# Patient Record
Sex: Male | Born: 1950 | Race: White | Hispanic: No | Marital: Married | Smoking: Never smoker
Health system: Southern US, Community
[De-identification: ages and names within clinical notes are randomized; demographics above are authoritative.]

## PROBLEM LIST (undated history)

## (undated) DIAGNOSIS — K219 Gastro-esophageal reflux disease without esophagitis: Secondary | ICD-10-CM

## (undated) DIAGNOSIS — M545 Low back pain, unspecified: Secondary | ICD-10-CM

## (undated) DIAGNOSIS — I77819 Aortic ectasia, unspecified site: Secondary | ICD-10-CM

## (undated) DIAGNOSIS — G8929 Other chronic pain: Secondary | ICD-10-CM

## (undated) DIAGNOSIS — E785 Hyperlipidemia, unspecified: Secondary | ICD-10-CM

## (undated) DIAGNOSIS — H9190 Unspecified hearing loss, unspecified ear: Secondary | ICD-10-CM

## (undated) DIAGNOSIS — G609 Hereditary and idiopathic neuropathy, unspecified: Secondary | ICD-10-CM

## (undated) DIAGNOSIS — G629 Polyneuropathy, unspecified: Secondary | ICD-10-CM

## (undated) DIAGNOSIS — R1310 Dysphagia, unspecified: Secondary | ICD-10-CM

## (undated) DIAGNOSIS — R972 Elevated prostate specific antigen [PSA]: Secondary | ICD-10-CM

## (undated) DIAGNOSIS — N529 Male erectile dysfunction, unspecified: Secondary | ICD-10-CM

## (undated) DIAGNOSIS — K0510 Chronic gingivitis, plaque induced: Secondary | ICD-10-CM

## (undated) DIAGNOSIS — M1990 Unspecified osteoarthritis, unspecified site: Secondary | ICD-10-CM

## (undated) DIAGNOSIS — G4730 Sleep apnea, unspecified: Secondary | ICD-10-CM

## (undated) DIAGNOSIS — T4145XA Adverse effect of unspecified anesthetic, initial encounter: Secondary | ICD-10-CM

## (undated) DIAGNOSIS — R110 Nausea: Secondary | ICD-10-CM

## (undated) DIAGNOSIS — G43909 Migraine, unspecified, not intractable, without status migrainosus: Secondary | ICD-10-CM

## (undated) HISTORY — DX: Elevated prostate specific antigen (PSA): R97.20

## (undated) HISTORY — DX: Dysphagia, unspecified: R13.10

## (undated) HISTORY — DX: Aortic ectasia, unspecified site: I77.819

## (undated) HISTORY — DX: Gastro-esophageal reflux disease without esophagitis: K21.9

## (undated) HISTORY — DX: Polyneuropathy, unspecified: G62.9

## (undated) HISTORY — DX: Male erectile dysfunction, unspecified: N52.9

## (undated) HISTORY — PX: PROSTATE BIOPSY: SHX241

## (undated) HISTORY — DX: Chronic gingivitis, plaque induced: K05.10

## (undated) HISTORY — DX: Hereditary and idiopathic neuropathy, unspecified: G60.9

## (undated) HISTORY — DX: Nausea: R11.0

## (undated) HISTORY — DX: Sleep apnea, unspecified: G47.30

## (undated) HISTORY — PX: OTHER SURGICAL HISTORY: SHX169

## (undated) HISTORY — DX: Hyperlipidemia, unspecified: E78.5

## (undated) HISTORY — DX: Unspecified hearing loss, unspecified ear: H91.90

---
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
## 1968-04-03 DIAGNOSIS — T8859XA Other complications of anesthesia, initial encounter: Secondary | ICD-10-CM

## 1968-04-03 HISTORY — PX: OTHER SURGICAL HISTORY: SHX169

## 1968-04-03 HISTORY — DX: Other complications of anesthesia, initial encounter: T88.59XA

## 1984-04-03 HISTORY — PX: OTHER SURGICAL HISTORY: SHX169

## 2010-01-13 ENCOUNTER — Encounter: Admission: RE | Admit: 2010-01-13 | Discharge: 2010-01-13 | Payer: Self-pay | Admitting: Family Medicine

## 2010-01-26 ENCOUNTER — Encounter
Admission: RE | Admit: 2010-01-26 | Discharge: 2010-03-14 | Payer: Self-pay | Source: Home / Self Care | Attending: Family Medicine | Admitting: Family Medicine

## 2011-03-10 ENCOUNTER — Ambulatory Visit (HOSPITAL_COMMUNITY)
Admission: RE | Admit: 2011-03-10 | Discharge: 2011-03-10 | Disposition: A | Discharge status: Home / Self Care | Payer: BC Managed Care – PPO | Source: Ambulatory Visit | Attending: Family Medicine | Admitting: Family Medicine

## 2011-03-10 ENCOUNTER — Encounter: Payer: Self-pay | Admitting: Physical Medicine and Rehabilitation

## 2011-03-10 ENCOUNTER — Emergency Department (HOSPITAL_COMMUNITY): Payer: BC Managed Care – PPO

## 2011-03-10 ENCOUNTER — Other Ambulatory Visit (HOSPITAL_COMMUNITY): Payer: Self-pay | Admitting: Family Medicine

## 2011-03-10 ENCOUNTER — Emergency Department (HOSPITAL_COMMUNITY)
Admission: EM | Admit: 2011-03-10 | Discharge: 2011-03-10 | Disposition: A | Discharge status: Home / Self Care | Payer: BC Managed Care – PPO | Attending: Emergency Medicine | Admitting: Emergency Medicine

## 2011-03-10 DIAGNOSIS — K8050 Calculus of bile duct without cholangitis or cholecystitis without obstruction: Secondary | ICD-10-CM

## 2011-03-10 DIAGNOSIS — R109 Unspecified abdominal pain: Secondary | ICD-10-CM | POA: Insufficient documentation

## 2011-03-10 DIAGNOSIS — R1013 Epigastric pain: Secondary | ICD-10-CM | POA: Insufficient documentation

## 2011-03-10 DIAGNOSIS — R945 Abnormal results of liver function studies: Secondary | ICD-10-CM | POA: Insufficient documentation

## 2011-03-10 DIAGNOSIS — R112 Nausea with vomiting, unspecified: Secondary | ICD-10-CM | POA: Insufficient documentation

## 2011-03-10 LAB — COMPREHENSIVE METABOLIC PANEL
BUN: 17 mg/dL (ref 6–23)
CO2: 28 mEq/L (ref 19–32)
Chloride: 102 mEq/L (ref 96–112)
Creatinine, Ser: 1.13 mg/dL (ref 0.50–1.35)
GFR calc Af Amer: 79 mL/min — ABNORMAL LOW (ref >90)
GFR calc non Af Amer: 68 mL/min — ABNORMAL LOW (ref >90)
Glucose, Bld: 115 mg/dL — ABNORMAL HIGH (ref 70–99)
Total Bilirubin: 4.6 mg/dL — ABNORMAL HIGH (ref 0.3–1.2)

## 2011-03-10 LAB — URINALYSIS, ROUTINE W REFLEX MICROSCOPIC
Glucose, UA: NEGATIVE mg/dL
Hgb urine dipstick: NEGATIVE
Specific Gravity, Urine: 1.029 (ref 1.005–1.030)

## 2011-03-10 LAB — URINE MICROSCOPIC-ADD ON

## 2011-03-10 LAB — CBC
HCT: 46.7 % (ref 39.0–52.0)
MCV: 86.2 fL (ref 78.0–100.0)
RBC: 5.42 MIL/uL (ref 4.22–5.81)
WBC: 9.6 10*3/uL (ref 4.0–10.5)

## 2011-03-10 MED ORDER — ONDANSETRON HCL 4 MG/2ML IJ SOLN
4.0000 mg | Freq: Once | INTRAMUSCULAR | Status: AC
  Administered 2011-03-10: 4 mg via INTRAVENOUS
  Filled 2011-03-10: qty 2

## 2011-03-10 MED ORDER — SODIUM CHLORIDE 0.9 % IV BOLUS (SEPSIS)
500.0000 mL | Freq: Once | INTRAVENOUS | Status: AC
  Administered 2011-03-10: 1000 mL via INTRAVENOUS

## 2011-03-10 MED ORDER — HYDROCODONE-ACETAMINOPHEN 10-500 MG PO TABS: 1.0000 | ORAL_TABLET | Freq: Four times a day (QID) | ORAL | Status: AC | PRN

## 2011-03-10 MED ORDER — IOHEXOL 300 MG/ML  SOLN
100.0000 mL | Freq: Once | INTRAMUSCULAR | Status: AC | PRN
  Administered 2011-03-10: 100 mL via INTRAVENOUS

## 2011-03-10 MED ORDER — MORPHINE SULFATE 4 MG/ML IJ SOLN
4.0000 mg | Freq: Once | INTRAMUSCULAR | Status: AC
  Administered 2011-03-10: 4 mg via INTRAVENOUS
  Filled 2011-03-10: qty 1

## 2011-03-10 MED ORDER — OXYCODONE-ACETAMINOPHEN 5-325 MG PO TABS: 2.0000 | ORAL_TABLET | ORAL | Status: DC | PRN

## 2011-03-10 MED ORDER — SODIUM CHLORIDE 0.9 % IV SOLN: INTRAVENOUS | Status: DC

## 2011-03-10 NOTE — ED Provider Notes (Signed)
  Physical Exam  BP 123/89  Pulse 75  Temp(Src) 97.9 F (36.6 C) (Oral)  Resp 18  SpO2 96%  Physical Exam  Abdominal pain for the last several days. Abnormal LFTs. Nausea vomiting. Negative ultrasound. Mild tenderness. Will get CT and had a lipase.  ED Course  Procedures      Juliet Rude. Rubin Payor, MD 03/10/11 1818

## 2011-03-10 NOTE — ED Notes (Signed)
Transported to CT now

## 2011-03-10 NOTE — ED Notes (Signed)
Pt presents to department from PCP office for evaluation of abdominal pain. Ongoing for several days. Had ultrasound this morning, was negative. Pt states diffuse abdominal pain radiating to back. Also states nausea/vomiting. Pt states mild fever on Wednesday. He is alert and oriented x4. 8/10 pain upon arrival to ED.

## 2011-03-10 NOTE — ED Notes (Signed)
[facility] paged to [phone]

## 2011-03-13 ENCOUNTER — Encounter (HOSPITAL_COMMUNITY): Payer: Self-pay | Admitting: Gastroenterology

## 2011-03-14 ENCOUNTER — Encounter (HOSPITAL_COMMUNITY)
Admission: RE | Disposition: A | Discharge status: Home / Self Care | Payer: Self-pay | Source: Ambulatory Visit | Attending: Gastroenterology

## 2011-03-14 ENCOUNTER — Encounter (HOSPITAL_COMMUNITY): Payer: Self-pay | Admitting: Anesthesiology

## 2011-03-14 ENCOUNTER — Ambulatory Visit (HOSPITAL_COMMUNITY)
Admission: RE | Admit: 2011-03-14 | Discharge: 2011-03-14 | Disposition: A | Discharge status: Home / Self Care | Payer: BC Managed Care – PPO | Source: Ambulatory Visit | Attending: Gastroenterology | Admitting: Gastroenterology

## 2011-03-14 ENCOUNTER — Ambulatory Visit (HOSPITAL_COMMUNITY): Payer: BC Managed Care – PPO | Admitting: Anesthesiology

## 2011-03-14 ENCOUNTER — Encounter (HOSPITAL_COMMUNITY): Payer: Self-pay | Admitting: *Deleted

## 2011-03-14 ENCOUNTER — Ambulatory Visit (HOSPITAL_COMMUNITY): Payer: BC Managed Care – PPO

## 2011-03-14 DIAGNOSIS — K8070 Calculus of gallbladder and bile duct without cholecystitis without obstruction: Secondary | ICD-10-CM | POA: Insufficient documentation

## 2011-03-14 DIAGNOSIS — R109 Unspecified abdominal pain: Secondary | ICD-10-CM | POA: Insufficient documentation

## 2011-03-14 DIAGNOSIS — R7989 Other specified abnormal findings of blood chemistry: Secondary | ICD-10-CM | POA: Insufficient documentation

## 2011-03-14 HISTORY — DX: Migraine, unspecified, not intractable, without status migrainosus: G43.909

## 2011-03-14 HISTORY — PX: ERCP: SHX5425

## 2011-03-14 HISTORY — PX: SPHINCTEROTOMY: SHX5544

## 2011-03-14 HISTORY — DX: Gastro-esophageal reflux disease without esophagitis: K21.9

## 2011-03-14 SURGERY — ERCP, WITH INTERVENTION IF INDICATED
Anesthesia: Monitor Anesthesia Care

## 2011-03-14 MED ORDER — PROMETHAZINE HCL 25 MG/ML IJ SOLN: 6.2500 mg | INTRAMUSCULAR | Status: DC | PRN

## 2011-03-14 MED ORDER — FENTANYL CITRATE 0.05 MG/ML IJ SOLN
INTRAMUSCULAR | Status: DC | PRN
  Administered 2011-03-14 (×2): 50 ug via INTRAVENOUS

## 2011-03-14 MED ORDER — FENTANYL CITRATE 0.05 MG/ML IJ SOLN: 25.0000 ug | INTRAMUSCULAR | Status: DC | PRN

## 2011-03-14 MED ORDER — PROPOFOL 10 MG/ML IV EMUL
INTRAVENOUS | Status: DC | PRN
  Administered 2011-03-14: 75 ug/kg/min via INTRAVENOUS

## 2011-03-14 MED ORDER — SODIUM CHLORIDE 0.9 % IV SOLN
INTRAVENOUS | Status: DC | PRN
  Administered 2011-03-14: 12:00:00 via INTRAVENOUS

## 2011-03-14 MED ORDER — MIDAZOLAM HCL 5 MG/5ML IJ SOLN
INTRAMUSCULAR | Status: DC | PRN
  Administered 2011-03-14: 2 mg via INTRAVENOUS

## 2011-03-14 MED ORDER — LACTATED RINGERS IV SOLN
INTRAVENOUS | Status: DC
  Administered 2011-03-14: 1000 mL via INTRAVENOUS

## 2011-03-14 MED ORDER — SODIUM CHLORIDE 0.9 % IV SOLN
INTRAVENOUS | Status: DC | PRN
  Administered 2011-03-14: 13:00:00

## 2011-03-14 MED ORDER — BUTAMBEN-TETRACAINE-BENZOCAINE 2-2-14 % EX AERO
INHALATION_SPRAY | CUTANEOUS | Status: DC | PRN
  Administered 2011-03-14: 2 via TOPICAL

## 2011-03-14 MED ORDER — GLUCAGON HCL (RDNA) 1 MG IJ SOLR
INTRAMUSCULAR | Status: DC | PRN
  Administered 2011-03-14: 1 mg via INTRAVENOUS

## 2011-03-14 MED ORDER — CIPROFLOXACIN IN D5W 400 MG/200ML IV SOLN
INTRAVENOUS | Status: AC
  Filled 2011-03-14: qty 200

## 2011-03-14 MED ORDER — CIPROFLOXACIN IN D5W 400 MG/200ML IV SOLN
INTRAVENOUS | Status: DC | PRN
  Administered 2011-03-14: 400 mg via INTRAVENOUS

## 2011-03-14 NOTE — Anesthesia Postprocedure Evaluation (Signed)
  Anesthesia Post-op Note  Patient: Jeffrey Li  Procedure(s) Performed:  ENDOSCOPIC RETROGRADE CHOLANGIOPANCREATOGRAPHY (ERCP); SPHINCTEROTOMY  Patient Location: PACU  Anesthesia Type: MAC  Level of Consciousness: oriented and sedated  Airway and Oxygen Therapy: Patient Spontanous Breathing  Post-op Pain: mild  Post-op Assessment: Post-op Vital signs reviewed, Patient's Cardiovascular Status Stable, Respiratory Function Stable and Patent Airway  Post-op Vital Signs: stable  Complications: No apparent anesthesia complications

## 2011-03-14 NOTE — Transfer of Care (Signed)
Immediate Anesthesia Transfer of Care Note  Patient: Jeffrey Li  Procedure(s) Performed:  ENDOSCOPIC RETROGRADE CHOLANGIOPANCREATOGRAPHY (ERCP); SPHINCTEROTOMY  Patient Location: PACU  Anesthesia Type: MAC  Level of Consciousness: awake and sedated  Airway & Oxygen Therapy: Patient Spontanous Breathing and Patient connected to nasal cannula oxygen  Post-op Assessment: Report given to PACU RN and Post -op Vital signs reviewed and stable  Post vital signs: Reviewed and stable  Complications: No apparent anesthesia complications

## 2011-03-14 NOTE — Progress Notes (Signed)
Patient positioned in prone, safety belt applied,chest rolls applied,both lower extremity supported with a roll.

## 2011-03-14 NOTE — H&P (Signed)
Patient interval history reviewed.  Patient examined again.  There has been no change from documented H/P dated 03/13/2011 (scanned into chart from our office) except as documented above.

## 2011-03-14 NOTE — Op Note (Signed)
[facility] [address] [place], [place]  [address]  ERCP PROCEDURE REPORT  PATIENT:  Jeffrey Li, Jeffrey Li  MR#:  [id number] BIRTHDATE:  [birth year]  GENDER:  male  ENDOSCOPIST:  Willis Modena, MD REFRRING:           Darrow Bussing, MD  PROCEDURE DATE:  03/14/2011 PROCEDURE:  ERCP with sphincterotomy ASA CLASS:  Class II  INDICATIONS:  abdominal pain, elevated LFTs, dilated bile duct, CBD stone (seen on CT scan)  MEDICATIONS:   MAC sedation, administered by CRNA, cipro 400 mg IV, glucagon 1 mg iv, Cetacaine spray x 2  DESCRIPTION OF PROCEDURE:   After the risks benefits and alternatives of the procedure were thoroughly explained, informed consent was obtained.  The  endoscope was introduced through the mouth and advanced to the second portion of the duodenum.  The examination was normal with no endoscopic findings.  The scope was then completely withdrawn from the patient and the procedure terminated.  <<PROCEDUREIMAGES>>  FINDINGS:  Patulous but otherwise normal-appearing ampulla; good bile flow pre- and post-procedure.  Deep biliary access obtained. CBD about 8mm in diameter.  There was a small   5mm defect at the junction of the CBD/CHD.  Limited cystic duct and gallbladder filling normal.  Moderate biliary  sphincterotomy was performed with blended current without complication.  Variety of basket and balloon catheters were used to trawl the duct.  While I did not witness removal of a stone, all subsequent cholangiograms showed no residual filling defects.  There were no immediate complications.  Pancreatogram was not obtained, intentionally.  ENDOSCOPIC IMPRESSION:    1.  Cholecholithiasis, s/p ERCP with biliary sphincterotomy. 2.  No residual defects on post occlusion cholangiogram.  RECOMMENDATIONS:      1.  Watch for potential complications of procedure. 2.  No ASA/NSAIDs for 5 days post-sphincterotomy. 3.  Consider surgical evaluation for consideration  of cholecystectomy.  ______________________________ Willis Modena  CC:  n. eSIGNEDWillis Modena at 03/14/2011 01:01 PM  Leanor Kail, [id number]

## 2011-03-14 NOTE — Anesthesia Preprocedure Evaluation (Signed)
Anesthesia Evaluation  Patient identified by MRN, date of birth, ID band Patient awake    Reviewed: Allergy & Precautions, H&P , NPO status , Patient's Chart, lab work & pertinent test results, reviewed documented beta blocker date and time   Airway Mallampati: I TM Distance: >3 FB Neck ROM: Full    Dental  (+) Teeth Intact   Pulmonary neg pulmonary ROS,  clear to auscultation        Cardiovascular neg cardio ROS Regular Normal Denies cardiac symptoms   Neuro/Psych Negative Neurological ROS  Negative Psych ROS   GI/Hepatic Neg liver ROS, Bile duct stone   Endo/Other  Negative Endocrine ROS  Renal/GU negative Renal ROS  Genitourinary negative   Musculoskeletal negative musculoskeletal ROS (+)   Abdominal   Peds negative pediatric ROS (+)  Hematology negative hematology ROS (+)   Anesthesia Other Findings   Reproductive/Obstetrics negative OB ROS                           Anesthesia Physical Anesthesia Plan  ASA: II  Anesthesia Plan: General and MAC   Post-op Pain Management:    Induction:   Airway Management Planned: Mask  Additional Equipment:   Intra-op Plan:   Post-operative Plan:   Informed Consent: I have reviewed the patients History and Physical, chart, labs and discussed the procedure including the risks, benefits and alternatives for the proposed anesthesia with the patient or authorized representative who has indicated his/her understanding and acceptance.     Plan Discussed with: CRNA and Surgeon  Anesthesia Plan Comments:         Anesthesia Quick Evaluation

## 2011-03-15 ENCOUNTER — Encounter (HOSPITAL_COMMUNITY): Payer: Self-pay | Admitting: Gastroenterology

## 2011-11-02 ENCOUNTER — Other Ambulatory Visit: Payer: Self-pay | Admitting: Family Medicine

## 2011-11-02 DIAGNOSIS — M545 Low back pain, unspecified: Secondary | ICD-10-CM

## 2011-11-16 ENCOUNTER — Ambulatory Visit
Admission: RE | Admit: 2011-11-16 | Discharge: 2011-11-16 | Disposition: A | Discharge status: Home / Self Care | Payer: BC Managed Care – PPO | Source: Ambulatory Visit | Attending: Family Medicine | Admitting: Family Medicine

## 2011-11-16 DIAGNOSIS — M545 Low back pain, unspecified: Secondary | ICD-10-CM

## 2014-04-03 HISTORY — PX: RETINAL TEAR REPAIR CRYOTHERAPY: SHX5304

## 2016-04-05 ENCOUNTER — Other Ambulatory Visit: Payer: Self-pay | Admitting: Family Medicine

## 2016-04-05 ENCOUNTER — Ambulatory Visit
Admission: RE | Admit: 2016-04-05 | Discharge: 2016-04-05 | Disposition: A | Discharge status: Home / Self Care | Payer: BC Managed Care – PPO | Source: Ambulatory Visit | Attending: Family Medicine | Admitting: Family Medicine

## 2016-04-05 DIAGNOSIS — M25551 Pain in right hip: Secondary | ICD-10-CM

## 2017-04-19 ENCOUNTER — Encounter: Payer: Self-pay | Admitting: Neurology

## 2017-06-07 ENCOUNTER — Ambulatory Visit: Payer: BC Managed Care – PPO | Admitting: Neurology

## 2017-06-07 ENCOUNTER — Other Ambulatory Visit: Payer: Self-pay

## 2017-06-07 ENCOUNTER — Encounter: Payer: Self-pay | Admitting: Neurology

## 2017-06-07 VITALS — BP 127/74 | HR 78 | Ht 73.0 in | Wt 215.5 lb

## 2017-06-07 DIAGNOSIS — R202 Paresthesia of skin: Secondary | ICD-10-CM

## 2017-06-07 NOTE — Progress Notes (Signed)
Reason for visit: Peripheral neuropathy  Referring physician: Dr. Adele Dan is a 67 y.o. male  History of present illness:  Jeffrey Li is a 67 year old left-handed white male with a history of numbness in the feet that dates back about 3 or 4 years.  The patient has noted slow gradual progression of his symptoms over time, the symptoms initially were in the toes only, now the entire foot is involved with numbness.  The patient has cold sensations and at times, particularly at night he may have burning sensations in the feet.  He denies that this keeps him awake at night.  He denies any balance issues or weakness in the legs.  He does have chronic low back pain without radiation down the legs on either side.  He last had MRI evaluation of the lumbar spine in 2013 that did not show evidence of spinal stenosis.  The patient reports no difficulty controlling the bowels or the bladder.  He does not have sensory alterations in the hands.  He does have some occasional neck pain.  The patient has had some blood work that included a vitamin B12 level that was unremarkable.  ANA was negative, sed rate was 1.  The patient comes to this office for an evaluation.  Past Medical History:  Diagnosis Date  . Erectile dysfunction   . GERD (gastroesophageal reflux disease)   . Gout   . Migraine headache   . Sleep apnea     Past Surgical History:  Procedure Laterality Date  . ERCP  03/14/2011   Procedure: ENDOSCOPIC RETROGRADE CHOLANGIOPANCREATOGRAPHY (ERCP);  Surgeon: Landry Dyke, MD;  Location: [facility];  Service: Endoscopy;  Laterality: N/A;  . left knee  1986  . RETINAL TEAR REPAIR CRYOTHERAPY Right 2016  . right shoulder  1970   Repair of torn AC joint right shoulder  . SPHINCTEROTOMY  03/14/2011   Procedure: SPHINCTEROTOMY;  Surgeon: Landry Dyke, MD;  Location: [facility];  Service: Endoscopy;  Laterality: N/A;    Family History  Problem Relation Age of Onset  .  Stroke Mother   . Prostate cancer Father   . Glaucoma Father   . Kidney failure Father   . Thyroid disease Sister   . Diverticulitis Sister   . Thyroid disease Sister   . Thyroid disease Sister   . Migraines Brother   . Colon cancer Neg Hx     Social history:  reports that  has never smoked. he has never used smokeless tobacco. He reports that he drinks alcohol. He reports that he uses drugs. Drug: Hydrocodone.  Medications:  Prior to Admission medications   Medication Sig Start Date End Date Taking? Authorizing Provider  allopurinol (ZYLOPRIM) 300 MG tablet Take 150 mg by mouth daily.   Yes [provider]  Alpha-Lipoic Acid 300 MG CAPS Take 2 capsules by mouth daily.   Yes [provider]  B COMPLEX-BIOTIN-FA PO Take 2 capsules by mouth daily.   Yes [provider]  Coenzyme Q10 (CO Q 10 PO) Take 400 mg by mouth daily.    Yes [provider]  esomeprazole (NEXIUM) 40 MG capsule Take 40 mg by mouth daily at 12 noon.   Yes [provider]  fluocinonide ointment (LIDEX) 7.20 % Apply 1 application topically daily as needed.   Yes [provider]  ibuprofen (ADVIL,MOTRIN) 200 MG tablet Take 200 mg by mouth 2 (two) times daily.    Yes [provider]  loratadine (CLARITIN) 10 MG tablet Take 10 mg by mouth daily.   Yes [provider]  MAGNESIUM-CALCIUM-FOLIC ACID PO Take 893 mg by mouth daily.    Yes [provider]  Multiple Vitamins-Minerals (MULTIVITAMINS THER. W/MINERALS) TABS Take 1 tablet by mouth daily.     Yes [provider]  Omega-3 Fatty Acids (OMEGA-3 FISH OIL PO) Take 720 mg by mouth daily.    Yes [provider]  sildenafil (REVATIO) 20 MG tablet Take 20 mg by mouth as needed. Take 2-5 tablets when needed   Yes [provider]  VITAMIN E PO Take 2 capsules by mouth daily. 450mg - 2 capsules daily   Yes [provider]      Allergies  Allergen Reactions  . No  Known Drug Allergy   . Shellfish Allergy Nausea And Vomiting    mollusk    ROS:  Out of a complete 14 system review of symptoms, the patient complains only of the following symptoms, and all other reviewed systems are negative.  Numbness Snoring  Blood pressure 127/74, pulse 78, height 6\' 1"  (1.854 m), weight 215 lb 8 oz (97.8 kg).  Physical Exam  General: The patient is alert and cooperative at the time of the examination.  Eyes: Pupils are equal, round, and reactive to light. Discs are flat bilaterally.  Neck: The neck is supple, no carotid bruits are noted.  Respiratory: The respiratory examination is clear.  Cardiovascular: The cardiovascular examination reveals a regular rate and rhythm, no obvious murmurs or rubs are noted.  Neuromuscular: Range of movement of the low back is full.  Skin: Extremities are without significant edema.  Neurologic Exam  Mental status: The patient is alert and oriented x 3 at the time of the examination. The patient has apparent normal recent and remote memory, with an apparently normal attention span and concentration ability.  Cranial nerves: Facial symmetry is present. There is good sensation of the face to pinprick and soft touch bilaterally. The strength of the facial muscles and the muscles to head turning and shoulder shrug are normal bilaterally. Speech is well enunciated, no aphasia or dysarthria is noted. Extraocular movements are full. Visual fields are full. The tongue is midline, and the patient has symmetric elevation of the soft palate. No obvious hearing deficits are noted.  Motor: The motor testing reveals 5 over 5 strength of all 4 extremities. Good symmetric motor tone is noted throughout.  Sensory: Sensory testing is intact to pinprick, soft touch, vibration sensation, and position sense on all 4 extremities, with exception of a stocking pattern pinprick sensory deficit up to the distal third of the legs bilaterally. No  evidence of extinction is noted.  Coordination: Cerebellar testing reveals good finger-nose-finger and heel-to-shin bilaterally.  Gait and station: Gait is normal. Tandem gait is normal. Romberg is negative. No drift is seen.  The patient is able to walk on heels and the toes bilaterally.  Reflexes: Deep tendon reflexes are symmetric and normal bilaterally. Toes are downgoing bilaterally.   Assessment/Plan:  1.  Numbness in the feet, probable peripheral neuropathy  The patient likely has a mild peripheral neuropathy.  The patient will be sent for further blood work today, nerve conduction studies will be done on both legs and one arm, EMG will be done on one leg.  He indicates that the discomfort is not severe currently, we will not start any medications for symptomatic benefit at this time.  Jill Alexanders MD 06/07/2017 8:59 AM  [facility] [address] [place], [place] [address]  Phone [phone] Fax [phone]

## 2017-06-07 NOTE — Patient Instructions (Signed)
   We will get EMG and NCV study to look at the nerve function of the legs.

## 2017-06-12 ENCOUNTER — Telehealth: Payer: Self-pay | Admitting: *Deleted

## 2017-06-12 LAB — MULTIPLE MYELOMA PANEL, SERUM
ALPHA2 GLOB SERPL ELPH-MCNC: 0.6 g/dL (ref 0.4–1.0)
Albumin SerPl Elph-Mcnc: 3.6 g/dL (ref 2.9–4.4)
Albumin/Glob SerPl: 1.4 (ref 0.7–1.7)
Alpha 1: 0.2 g/dL (ref 0.0–0.4)
B-GLOBULIN SERPL ELPH-MCNC: 1.1 g/dL (ref 0.7–1.3)
GLOBULIN, TOTAL: 2.7 g/dL (ref 2.2–3.9)
Gamma Glob SerPl Elph-Mcnc: 0.8 g/dL (ref 0.4–1.8)
IGG (IMMUNOGLOBIN G), SERUM: 714 mg/dL (ref 700–1600)
IGM (IMMUNOGLOBULIN M), SRM: 80 mg/dL (ref 20–172)
IgA/Immunoglobulin A, Serum: 163 mg/dL (ref 61–437)
Total Protein: 6.3 g/dL (ref 6.0–8.5)

## 2017-06-12 LAB — ANGIOTENSIN CONVERTING ENZYME: Angio Convert Enzyme: 58 U/L (ref 14–82)

## 2017-06-12 LAB — B. BURGDORFI ANTIBODIES

## 2017-06-12 LAB — RHEUMATOID FACTOR: Rhuematoid fact SerPl-aCnc: <10 IU/mL (ref 0.0–13.9)

## 2017-06-12 NOTE — Telephone Encounter (Signed)
Called, LVM for pt about unremarkable labs per CW,MD note. Gave [facility] phone number if he has further questions/concerns.

## 2017-06-12 NOTE — Telephone Encounter (Signed)
-----   Message from Kathrynn Ducking, MD sent at 06/12/2017  7:27 AM EDT -----   The blood work results are unremarkable. Please call the patient.  ----- Message ----- From: Lavone Neri Lab Results In Sent: 06/08/2017   7:43 AM To: Kathrynn Ducking, MD

## 2017-07-03 ENCOUNTER — Telehealth: Payer: Self-pay | Admitting: Neurology

## 2017-07-03 ENCOUNTER — Ambulatory Visit (INDEPENDENT_AMBULATORY_CARE_PROVIDER_SITE_OTHER): Payer: BC Managed Care – PPO | Admitting: Neurology

## 2017-07-03 ENCOUNTER — Encounter: Payer: Self-pay | Admitting: Neurology

## 2017-07-03 ENCOUNTER — Ambulatory Visit: Payer: BC Managed Care – PPO | Admitting: Neurology

## 2017-07-03 DIAGNOSIS — G603 Idiopathic progressive neuropathy: Secondary | ICD-10-CM

## 2017-07-03 DIAGNOSIS — R202 Paresthesia of skin: Secondary | ICD-10-CM | POA: Diagnosis not present

## 2017-07-03 DIAGNOSIS — M5416 Radiculopathy, lumbar region: Secondary | ICD-10-CM | POA: Diagnosis not present

## 2017-07-03 DIAGNOSIS — G629 Polyneuropathy, unspecified: Secondary | ICD-10-CM

## 2017-07-03 HISTORY — DX: Polyneuropathy, unspecified: G62.9

## 2017-07-03 NOTE — Telephone Encounter (Signed)
[employer] Auth: [id number] (exp. 07/03/17 to 08/01/17) lvm for pt to call back about scheduling mri

## 2017-07-03 NOTE — Progress Notes (Addendum)
Patient comes in for EMG and nerve conduction study today.  Nerve conduction studies show what appears to be a mild to moderate peripheral neuropathy, EMG of the right leg however shows changes that appeared to suggest an acute mild S1 radiculopathy, possibly some chronic changes at the L5 level as well.  Prior lumbosacral spine MRI done in 2013 did not show any spinal stenosis or nerve root impingement.  We will repeat the study.    [place]    Nerve / Sites Muscle Latency Ref. Amplitude Ref. Rel Amp Segments Distance Velocity Ref. Area    ms ms mV mV %  cm m/s m/s mVms  R Median - APB     Wrist APB 4.4 ?4.4 7.0 ?4.0 100 Wrist - APB 7   26.6     Upper arm APB 9.3  6.8  97.3 Upper arm - Wrist 24 49 ?49 25.9  R Ulnar - ADM     Wrist ADM 2.8 ?3.3 6.1 ?6.0 100 Wrist - ADM 7   21.6     B.Elbow ADM 7.4  5.6  91.1 B.Elbow - Wrist 24 52 ?49 20.9     A.Elbow ADM 9.4  5.5  98 A.Elbow - B.Elbow 10 49 ?49 20.5         A.Elbow - Wrist      R Peroneal - EDB     Ankle EDB 4.2 ?6.5 3.2 ?2.0 100 Ankle - EDB 9   11.4     Fib head EDB 13.7  2.3  72.1 Fib head - Ankle 33 35 ?44 8.7     Pop fossa EDB 16.5  2.6  112 Pop fossa - Fib head 10 36 ?44 8.8         Pop fossa - Ankle      L Peroneal - EDB     Ankle EDB 5.3 ?6.5 4.9 ?2.0 100 Ankle - EDB 9   18.6     Fib head EDB 14.8  4.6  94.1 Fib head - Ankle 33 35 ?44 17.4     Pop fossa EDB 17.7  4.8  103 Pop fossa - Fib head 10 36 ?44 18.3         Pop fossa - Ankle      L Tibial - AH     Ankle AH 6.4 ?5.8 0.7 ?4.0 100 Ankle - AH 9   1.4     Pop fossa AH 18.9  0.6  93.3 Pop fossa - Ankle 42 34 ?41 2.3  R Tibial - AH     Ankle AH 5.2 ?5.8 1.0 ?4.0 100 Ankle - AH 9   1.9     Pop fossa AH 18.2  0.6  55.1 Pop fossa - Ankle 42 32 ?41 1.4                 SNC    Nerve / Sites Rec. Site Peak Lat Ref.  Amp Ref. Segments Distance    ms ms V V  cm  L Sural - Ankle (Calf)     Calf Ankle 4.6 ?4.4 4 ?6 Calf - Ankle 14  R Sural - Ankle (Calf)     Calf Ankle 4.3 ?4.4 2  ?6 Calf - Ankle 14  L Superficial peroneal - Ankle     Lat leg Ankle 4.2 ?4.4 1 ?6 Lat leg - Ankle 14  R Superficial peroneal - Ankle     Lat leg Ankle 4.4 ?4.4 1 ?6 Lat leg - Ankle  14  R Median - Orthodromic (Dig II, Mid palm)     Dig II Wrist 4.0 ?3.4 9 ?10 Dig II - Wrist 13  R Ulnar - Orthodromic, (Dig V, Mid palm)     Dig V Wrist 2.8 ?3.1 7 ?5 Dig V - Wrist 73                 F  Wave    Nerve F Lat Ref.   ms ms  R Ulnar - ADM 33.4 ?32.0  L Tibial - AH 66.7 ?56.0         EMG full

## 2017-07-03 NOTE — Progress Notes (Signed)
Please refer to EMG and nerve conduction study procedure note. 

## 2017-07-03 NOTE — Procedures (Signed)
     HISTORY:  Jeffrey Li is a 67 year old gentleman with a history of numbness in the feet for about 3 or 4 years.  He does report chronic low back pain without radiation down the legs on either side.  The patient is being evaluated for a possible neuropathy or a lumbosacral radiculopathy.  NERVE CONDUCTION STUDIES:  Nerve conduction studies were performed on the right upper extremity.  The distal motor latency for the right median nerve was prolonged with a normal motor amplitude.  The distal motor latency motor amplitude for the right ulnar nerve was normal.  The nerve conduction velocities for the right median and ulnar nerves were normal.  The sensory latency for the right ulnar nerve was normal, but was prolonged for the right median nerve.  The F-wave latency for the right ulnar nerve was prolonged.  Nerve conduction studies were performed on both lower extremities.  The distal motor latencies for the peroneal nerves were normal bilaterally with normal motor amplitudes for these nerves.  Slowing was seen for the peroneal nerves bilaterally.  The distal motor latency for the left posterior tibial nerve was prolonged, normal on the right.  The motor amplitudes for these nerves were low bilaterally.  There was slowing seen for the posterior tibial nerves bilaterally.  The sural sensory latencies were prolonged on the left and normal on the right with a normal sensory latency for the peroneal nerve on the left, borderline normal on the right.  The left posterior tibial nerve F-wave latency was prolonged.  EMG STUDIES:  EMG study was performed on the right lower extremity:  The tibialis anterior muscle reveals 2 to 4K motor units with full recruitment. No fibrillations or positive waves were seen. The peroneus tertius muscle reveals 2 to 4K motor units with full recruitment. No fibrillations or positive waves were seen. The medial gastrocnemius muscle reveals 1 to 3K motor units with slight  reduction of recruitment.  1+ positive waves were seen. The vastus lateralis muscle reveals 2 to 4K motor units with full recruitment. No fibrillations or positive waves were seen. The iliopsoas muscle reveals 2 to 5K motor units with slightly decreased recruitment. No fibrillations or positive waves were seen. The biceps femoris muscle (long head) reveals 2 to 4K motor units with full recruitment. No fibrillations or positive waves were seen. The lumbosacral paraspinal muscles were tested at 3 levels, and revealed 1+ positive waves at the upper and middle levels, 2+ positive waves at the lower level. There was good relaxation.   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  There is evidence of an overlying mild right carpal tunnel syndrome.  EMG evaluation of the right lower extremity shows findings that are consistent with an acute mild S1 radiculopathy with some chronic changes at the L5 level as well.  Jill Alexanders MD 07/03/2017 11:17 AM  [facility] [address] [place], [place] [address]  Phone [phone] Fax [phone]

## 2017-07-03 NOTE — Telephone Encounter (Signed)
Patient returned my call he is scheduled for Wed. 07/11/17 on the [facility] mobile unit.

## 2017-07-11 ENCOUNTER — Ambulatory Visit: Payer: BC Managed Care – PPO

## 2017-07-11 DIAGNOSIS — R202 Paresthesia of skin: Secondary | ICD-10-CM

## 2017-07-11 DIAGNOSIS — M5416 Radiculopathy, lumbar region: Secondary | ICD-10-CM

## 2017-07-11 MED ORDER — GADOPENTETATE DIMEGLUMINE 469.01 MG/ML IV SOLN: 20.0000 mL | Freq: Once | INTRAVENOUS | Status: AC | PRN

## 2017-07-13 ENCOUNTER — Encounter: Payer: Self-pay | Admitting: Neurology

## 2017-07-13 ENCOUNTER — Telehealth: Payer: Self-pay | Admitting: Neurology

## 2017-07-13 NOTE — Telephone Encounter (Signed)
I called again, I am unable to reach the patient.  I will send him a letter regarding the results of the MRI.

## 2017-07-13 NOTE — Telephone Encounter (Signed)
I tried to call the patient again, no answer.  I will try again later.

## 2017-07-13 NOTE — Telephone Encounter (Signed)
  I tried to call patient, no answer.  The MRI of the low back does not show much change from 2013, no evidence of definite nerve root impingement.  Changes are noted on EMG suggesting some acute denervation and S1 nerve roots and possible overlying L5 chronic changes.  Blood work does not show an etiology for these changes.  The patient does have some low back pain, could possibly consider epidural steroid injection, initiate medication for discomfort.  No surgically amenable problems.  I will try to call back later.   MRI lumbar 07/12/17:  IMPRESSION:   MRI lumbar spine (without) demonstrating: - Minimal disc bulging at T12-L1, L1-2, L4-5. no spinal stenosis or foraminal narrowing  - No significant change from MRI on 11/16/11.

## 2018-05-13 ENCOUNTER — Other Ambulatory Visit: Payer: Self-pay | Admitting: Family Medicine

## 2018-05-13 DIAGNOSIS — R1904 Left lower quadrant abdominal swelling, mass and lump: Secondary | ICD-10-CM

## 2018-05-31 ENCOUNTER — Encounter: Payer: Self-pay | Admitting: Radiology

## 2018-05-31 ENCOUNTER — Ambulatory Visit
Admission: RE | Admit: 2018-05-31 | Discharge: 2018-05-31 | Disposition: A | Discharge status: Home / Self Care | Payer: BC Managed Care – PPO | Source: Ambulatory Visit | Attending: Family Medicine | Admitting: Family Medicine

## 2018-05-31 DIAGNOSIS — R1904 Left lower quadrant abdominal swelling, mass and lump: Secondary | ICD-10-CM

## 2018-05-31 MED ORDER — IOPAMIDOL (ISOVUE-300) INJECTION 61%
100.0000 mL | Freq: Once | INTRAVENOUS | Status: AC | PRN
  Administered 2018-05-31: 100 mL via INTRAVENOUS

## 2018-10-15 NOTE — Patient Instructions (Addendum)
YOU NEED TO HAVE A COVID 19 TEST ON 10-21-2018 AT 950 AM. THIS TEST MUST BE DONE BEFORE SURGERY, COME TO [facility] ENTRANCE. ONCE YOUR COVID TEST IS COMPLETED, PLEASE BEGIN THE QUARANTINE INSTRUCTIONS AS OUTLINED IN YOUR HANDOUT.                Jeffrey Li    Your procedure is scheduled on: 10-24-2018  Report to [facility] Main  Entrance  Report to admitting at 930  AM      Call this number if you have problems the morning of surgery [phone]    Remember: Jeffrey Li, NO Davey.   DO NOT EAT OR DRINK AFTER MIDNIGHT.   _____________________________________________________________________     Take these medicines the morning of surgery with A SIP OF WATER: NONE              You may not have any metal on your body including hair pins and              piercings  Do not wear jewelry, make-up, lotions, powders or perfumes, deodorant                       Men may shave face and neck.   Do not bring valuables to the hospital. Jeffrey Li.  Contacts, dentures or bridgework may not be worn into surgery.  Leave suitcase in the car. After surgery it may be brought to your room.     Patients discharged the day of surgery will not be allowed to drive home. IF YOU ARE HAVING SURGERY AND GOING HOME THE SAME DAY, YOU MUST HAVE AN ADULT TO DRIVE YOU HOME AND BE WITH YOU FOR 24 HOURS. YOU MAY GO HOME BY TAXI OR UBER OR ORTHERWISE, BUT AN ADULT MUST ACCOMPANY YOU HOME AND STAY WITH YOU FOR 24 HOURS.  Name and phone number of your driver: [name] [phone]  Special Instructions: N/A              Please read over the following fact sheets you were given: _____________________________________________________________________             [facility] - Preparing for Surgery Before surgery, you can play an important role.  Because  skin is not sterile, your skin needs to be as free of germs as possible.  You can reduce the number of germs on your skin by washing with CHG (chlorahexidine gluconate) soap before surgery.  CHG is an antiseptic cleaner which kills germs and bonds with the skin to continue killing germs even after washing. Please DO NOT use if you have an allergy to CHG or antibacterial soaps.  If your skin becomes reddened/irritated stop using the CHG and inform your nurse when you arrive at Short Stay. Do not shave (including legs and underarms) for at least 48 hours prior to the first CHG shower.  You may shave your face/neck. Please follow these instructions carefully:  1.  Shower with CHG Soap the night before surgery and the  morning of Surgery.  2.  If you choose to wash your hair, wash your hair first as usual with your  normal  shampoo.  3.  After you shampoo, rinse your hair and body thoroughly to remove the  shampoo.                           4.  Use CHG as you would any other liquid soap.  You can apply chg directly  to the skin and wash                       Gently with a scrungie or clean washcloth.  5.  Apply the CHG Soap to your body ONLY FROM THE NECK DOWN.   Do not use on face/ open                           Wound or open sores. Avoid contact with eyes, ears mouth and genitals (private parts).                       Wash face,  Genitals (private parts) with your normal soap.             6.  Wash thoroughly, paying special attention to the area where your surgery  will be performed.  7.  Thoroughly rinse your body with warm water from the neck down.  8.  DO NOT shower/wash with your normal soap after using and rinsing off  the CHG Soap.                9.  Pat yourself dry with a clean towel.            10.  Wear clean pajamas.            11.  Place clean sheets on your bed the night of your first shower and do not  sleep with pets. Day of Surgery : Do not apply any lotions/deodorants the morning of  surgery.  Please wear clean clothes to the hospital/surgery center.  FAILURE TO FOLLOW THESE INSTRUCTIONS MAY RESULT IN THE CANCELLATION OF YOUR SURGERY PATIENT SIGNATURE_________________________________  NURSE SIGNATURE__________________________________  ________________________________________________________________________

## 2018-10-16 ENCOUNTER — Other Ambulatory Visit (HOSPITAL_COMMUNITY): Payer: Self-pay | Admitting: *Deleted

## 2018-10-16 ENCOUNTER — Ambulatory Visit: Payer: Self-pay | Admitting: General Surgery

## 2018-10-17 ENCOUNTER — Encounter (HOSPITAL_COMMUNITY)
Admission: RE | Admit: 2018-10-17 | Discharge: 2018-10-17 | Disposition: A | Discharge status: Home / Self Care | Payer: BC Managed Care – PPO | Source: Ambulatory Visit | Attending: General Surgery | Admitting: General Surgery

## 2018-10-17 ENCOUNTER — Encounter (HOSPITAL_COMMUNITY): Payer: Self-pay

## 2018-10-17 ENCOUNTER — Other Ambulatory Visit: Payer: Self-pay

## 2018-10-17 DIAGNOSIS — M545 Low back pain: Secondary | ICD-10-CM | POA: Diagnosis not present

## 2018-10-17 DIAGNOSIS — K4020 Bilateral inguinal hernia, without obstruction or gangrene, not specified as recurrent: Secondary | ICD-10-CM | POA: Insufficient documentation

## 2018-10-17 DIAGNOSIS — M109 Gout, unspecified: Secondary | ICD-10-CM | POA: Diagnosis not present

## 2018-10-17 DIAGNOSIS — Z79899 Other long term (current) drug therapy: Secondary | ICD-10-CM | POA: Diagnosis not present

## 2018-10-17 DIAGNOSIS — Z01812 Encounter for preprocedural laboratory examination: Secondary | ICD-10-CM | POA: Insufficient documentation

## 2018-10-17 DIAGNOSIS — G629 Polyneuropathy, unspecified: Secondary | ICD-10-CM | POA: Diagnosis not present

## 2018-10-17 HISTORY — DX: Unspecified osteoarthritis, unspecified site: M19.90

## 2018-10-17 HISTORY — DX: Low back pain, unspecified: M54.50

## 2018-10-17 HISTORY — DX: Other chronic pain: G89.29

## 2018-10-17 LAB — BASIC METABOLIC PANEL
Anion gap: 7 (ref 5–15)
BUN: 26 mg/dL — ABNORMAL HIGH (ref 8–23)
CO2: 27 mmol/L (ref 22–32)
Calcium: 9.9 mg/dL (ref 8.9–10.3)
Chloride: 107 mmol/L (ref 98–111)
Creatinine, Ser: 1.16 mg/dL (ref 0.61–1.24)
GFR calc Af Amer: >60 mL/min (ref >60)
GFR calc non Af Amer: >60 mL/min (ref >60)
Glucose, Bld: 111 mg/dL — ABNORMAL HIGH (ref 70–99)
Potassium: 5.7 mmol/L — ABNORMAL HIGH (ref 3.5–5.1)
Sodium: 141 mmol/L (ref 135–145)

## 2018-10-17 LAB — CBC WITH DIFFERENTIAL/PLATELET
Abs Immature Granulocytes: 0.03 10*3/uL (ref 0.00–0.07)
Basophils Absolute: 0 10*3/uL (ref 0.0–0.1)
Basophils Relative: 1 %
Eosinophils Absolute: 0.2 10*3/uL (ref 0.0–0.5)
Eosinophils Relative: 5 %
HCT: 48.2 % (ref 39.0–52.0)
Hemoglobin: 16.2 g/dL (ref 13.0–17.0)
Immature Granulocytes: 1 %
Lymphocytes Relative: 19 %
Lymphs Abs: 1 10*3/uL (ref 0.7–4.0)
MCH: 30.7 pg (ref 26.0–34.0)
MCHC: 33.6 g/dL (ref 30.0–36.0)
MCV: 91.3 fL (ref 80.0–100.0)
Monocytes Absolute: 0.5 10*3/uL (ref 0.1–1.0)
Monocytes Relative: 10 %
Neutro Abs: 3.4 10*3/uL (ref 1.7–7.7)
Neutrophils Relative %: 64 %
Platelets: 216 10*3/uL (ref 150–400)
RBC: 5.28 MIL/uL (ref 4.22–5.81)
RDW: 13 % (ref 11.5–15.5)
WBC: 5.2 10*3/uL (ref 4.0–10.5)
nRBC: 0 % (ref 0.0–0.2)

## 2018-10-18 NOTE — Anesthesia Preprocedure Evaluation (Addendum)
Anesthesia Evaluation  Patient identified by MRN, date of birth, ID band Patient awake    Reviewed: Allergy & Precautions, NPO status , Patient's Chart, lab work & pertinent test results  History of Anesthesia Complications Negative for: history of anesthetic complications  Airway Mallampati: II  TM Distance: >3 FB Neck ROM: Full    Dental  (+) Dental Advisory Given   Pulmonary sleep apnea , neg recent URI,    breath sounds clear to auscultation       Cardiovascular negative cardio ROS   Rhythm:Regular     Neuro/Psych  Headaches,  Neuromuscular disease negative psych ROS   GI/Hepatic Neg liver ROS, GERD  ,BILATERAL INUGINAL HERNIAS   Endo/Other  negative endocrine ROS  Renal/GU negative Renal ROS     Musculoskeletal  (+) Arthritis ,   Abdominal   Peds  Hematology negative hematology ROS (+)   Anesthesia Other Findings   Reproductive/Obstetrics                                                             Anesthesia Evaluation  Patient identified by MRN, date of birth, ID band Patient awake    Reviewed: Allergy & Precautions, H&P , NPO status , Patient's Chart, lab work & pertinent test results, reviewed documented beta blocker date and time   Airway Mallampati: I TM Distance: >3 FB Neck ROM: Full    Dental  (+) Teeth Intact   Pulmonary neg pulmonary ROS,  clear to auscultation        Cardiovascular neg cardio ROS Regular Normal Denies cardiac symptoms   Neuro/Psych Negative Neurological ROS  Negative Psych ROS   GI/Hepatic Neg liver ROS, Bile duct stone   Endo/Other  Negative Endocrine ROS  Renal/GU negative Renal ROS  Genitourinary negative   Musculoskeletal negative musculoskeletal ROS (+)   Abdominal   Peds negative pediatric ROS (+)  Hematology negative hematology ROS (+)   Anesthesia Other Findings   Reproductive/Obstetrics negative OB  ROS                           Anesthesia Physical Anesthesia Plan  ASA: II  Anesthesia Plan: General and MAC   Post-op Pain Management:    Induction:   Airway Management Planned: Mask  Additional Equipment:   Intra-op Plan:   Post-operative Plan:   Informed Consent: I have reviewed the patients History and Physical, chart, labs and discussed the procedure including the risks, benefits and alternatives for the proposed anesthesia with the patient or authorized representative who has indicated his/her understanding and acceptance.     Plan Discussed with: CRNA and Surgeon  Anesthesia Plan Comments:         Anesthesia Quick Evaluation  Anesthesia Physical Anesthesia Plan  ASA: II  Anesthesia Plan: General   Post-op Pain Management:    Induction: Intravenous  PONV Risk Score and Plan: 2 and Ondansetron and Dexamethasone  Airway Management Planned: Oral ETT  Additional Equipment: None  Intra-op Plan:   Post-operative Plan: Extubation in OR  Informed Consent: I have reviewed the patients History and Physical, chart, labs and discussed the procedure including the risks, benefits and alternatives for the proposed anesthesia with the patient or authorized representative who has indicated his/her understanding and acceptance.  Dental advisory given  Plan Discussed with: CRNA and Surgeon  Anesthesia Plan Comments: (See APP note by Willeen Cass, FNP)       Anesthesia Quick Evaluation

## 2018-10-18 NOTE — Progress Notes (Signed)
Anesthesia Chart Review:   Case: [id number] Date/Time: 10/24/18 1115   Procedure: LAPAROSCOPIC BILATERAL INGUINAL HERNIAS REPAIR WITH MESH (Bilateral )   Anesthesia type: General   Pre-op diagnosis: BILATERAL INUGINAL HERNIAS   Location: [place] [facility] / [facility]   Surgeon: Greer Pickerel, MD      DISCUSSION:  Pt is a 68 year old male with hx OSA (apparently resolved on last test 2019).   - K 5.7 at presurgical testing. I notified Kennyth Lose in Dr. Dois Davenport office; will recheck K day of surgery   VS: BP (!) 130/98   Pulse 80   Temp 36.9 C (Oral)   Resp 16   Ht 6\' 1"  (1.854 m)   Wt 99.4 kg   SpO2 97%   BMI 28.92 kg/m     PROVIDERS: Primary care at  [facility], [place]:  K 5.7  (all labs ordered are listed, but only abnormal results are displayed)  Labs Reviewed  BASIC METABOLIC PANEL - Abnormal; Notable for the following components:      Result Value   Potassium 5.7 (*)    Glucose, Bld 111 (*)    BUN 26 (*)    All other components within normal limits  CBC WITH DIFFERENTIAL/PLATELET     EKG: N/A   Past Medical History:  Diagnosis Date  . 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 Chronic low back pain   . Complication of anesthesia 1970   "TOO MUCH ANESTHETIC AFTER TRASHING AROUND HAD ANESTHESIA OVERDOSE"  . Erectile dysfunction   . GERD (gastroesophageal reflux disease)   . Gout   . Migraine headache    NONE IN 20 YEARS  . Peripheral neuropathy 07/03/2017   TOES AND FEET  . Sleep apnea    NONE WITH LATEST TEST 20-19    Past Surgical History:  Procedure Laterality Date  . ERCP  03/14/2011   Procedure: ENDOSCOPIC RETROGRADE CHOLANGIOPANCREATOGRAPHY (ERCP);  Surgeon: Landry Dyke, MD;  Location: [facility];  Service: Endoscopy;  Laterality: N/A;  . left knee  1986   SCOPE  . RETINAL TEAR REPAIR CRYOTHERAPY Right 2016  . right shoulder  1970   Repair of torn AC joint right shoulder  . SPHINCTEROTOMY  03/14/2011   Procedure:  SPHINCTEROTOMY;  Surgeon: Landry Dyke, MD;  Location: [facility];  Service: Endoscopy;  Laterality: N/A;  . SURGICAL REPAIR ZYGOMATIC ARCH  1977 OR 1978    MEDICATIONS: . acetaminophen (TYLENOL) 500 MG tablet  . allopurinol (ZYLOPRIM) 300 MG tablet  . Alpha-Lipoic Acid 300 MG CAPS  . B COMPLEX-BIOTIN-FA PO  . Coenzyme Q10 (CO Q 10 PO)  . fluocinonide ointment (LIDEX) 0.05 %  . ibuprofen (ADVIL,MOTRIN) 200 MG tablet  . loratadine (CLARITIN) 10 MG tablet  . MAGNESIUM-CALCIUM-FOLIC ACID PO  . Multiple Vitamins-Minerals (MULTIVITAMINS THER. W/MINERALS) TABS  . Omega-3 Fatty Acids (OMEGA-3 FISH OIL PO)  . sildenafil (REVATIO) 20 MG tablet  . VITAMIN E PO   No current facility-administered medications for this encounter.    . gadopentetate dimeglumine (MAGNEVIST) injection 20 mL    If labs acceptable day of surgery, I anticipate pt can proceed with surgery as scheduled.  Willeen Cass, FNP-BC [facility]/Anesthesiology Phone: [phone] 10/18/2018 4:20 PM

## 2018-10-21 ENCOUNTER — Other Ambulatory Visit (HOSPITAL_COMMUNITY)
Admission: RE | Admit: 2018-10-21 | Discharge: 2018-10-21 | Disposition: A | Discharge status: Home / Self Care | Payer: BC Managed Care – PPO | Source: Ambulatory Visit | Attending: General Surgery | Admitting: General Surgery

## 2018-10-21 DIAGNOSIS — Z1159 Encounter for screening for other viral diseases: Secondary | ICD-10-CM | POA: Diagnosis not present

## 2018-10-22 LAB — SARS CORONAVIRUS 2 (TAT 6-24 HRS): SARS Coronavirus 2: NEGATIVE

## 2018-10-23 NOTE — H&P (Signed)
Jeffrey Li II Documented: 09/19/2018 9:59 AM Location: [facility] Patient #: [id number] DOB: [birth year] Married / Language: English / Race: White Male   History of Present Illness Randall Hiss M. Audrina Marten MD; 09/19/2018 10:01 AM) The patient is a 68 year old male who presents with an inguinal hernia. I was asked to see this patient by my partner since he has bilateral hernias and would probably benefit from a laparoscopic approach as opposed to a bilateral inguinal incision. My partner or any documented a complete history and physical note. Briefly the patient is had a right groin hernia for quite some time and recently noticed to have swelling in his left groin and underwent a CT scan that confirmed bilateral inguinal hernias. A section of sigmoid colon which was nonobstructed was in the left inguinal hernia. He is referred here for evaluation. He denies any prior abdominal surgery. He is not on blood thinners. He does have discomfort in the groins mainly if he is doing some stretching activities like crossing over his leg. He feels like it is compressing sensation. He denies any burning, shooting or stabbing pain. He is a bilateral vasectomy. He denies any chest pain, chest pressure, source of breath, dyspnea on exertion. He does endorse some nocturia   Problem List/Past Medical Randall Hiss M. 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 Redmond Pulling, MD; 09/19/2018 10:03 AM) Knee Surgery  Left. Shoulder Surgery  Right. Vasectomy   Diagnostic Studies History Randall Hiss M. Redmond Pulling, MD; 09/19/2018 10:03 AM) Colonoscopy  1-5 years ago  Allergies Randall Hiss M. Redmond Pulling, MD; 09/19/2018 10:03 AM) No Known Allergies  [09/18/2018]: No Known Drug Allergies  [09/18/2018]:  Medication History Randall Hiss M. Redmond Pulling, MD; 09/19/2018 10:03 AM) Allopurinol (100MG  Tablet, Oral) Active. Co Q-10 (30MG  Capsule, Oral)  Active. Magnesium (30MG  Tablet, Oral) Active.  Social History Randall Hiss M. Redmond Pulling, MD; 09/19/2018 10:03 AM) Alcohol use  Occasional alcohol use. Caffeine use  Coffee. No drug use  Tobacco use  Never smoker.  Family History Randall Hiss M. Redmond Pulling, MD; 09/19/2018 10:03 AM) Cerebrovascular Accident  Mother. Migraine Headache  Brother. Prostate Cancer  Father. Thyroid problems  Sister.  Other Problems Randall Hiss M. Redmond Pulling, MD; 09/19/2018 10:03 AM) Arthritis  Back Pain  Inguinal Hernia  Other disease, cancer, significant illness     Review of Systems Randall Hiss M. Dell Briner MD; 09/19/2018 10:01 AM) All other systems negative   Physical Exam Randall Hiss M. Montia Haslip MD; 09/19/2018 10:01 AM) General Mental Status-Alert. General Appearance-Consistent with stated age. Hydration-Well hydrated. Voice-Normal.  Head and Neck Head-normocephalic, atraumatic with no lesions or palpable masses. Trachea-midline. Thyroid Gland Characteristics - normal size and consistency.  Eye Eyeball - Bilateral-Extraocular movements intact. Sclera/Conjunctiva - Bilateral-No scleral icterus.  Chest and Lung Exam Chest and lung exam reveals -quiet, even and easy respiratory effort with no use of accessory muscles and on auscultation, normal breath sounds, no adventitious sounds and normal vocal resonance. Inspection Chest Wall - Normal. Back - normal.  Breast - Did not examine.  Cardiovascular Cardiovascular examination reveals -normal heart sounds, regular rate and rhythm with no murmurs and normal pedal pulses bilaterally.  Abdomen Inspection Inspection of the abdomen reveals - No Hernias. Skin - Scar - no surgical scars. Palpation/Percussion Palpation and Percussion of the abdomen reveal - Soft, Non Tender, No Rebound tenderness, No Rigidity (guarding) and No hepatosplenomegaly. Auscultation Auscultation of the abdomen reveals - Bowel sounds normal.  Male Genitourinary Note: Patient  examined standing. Both  testicles descended. Reducible bilateral inguinal hernias. No testicular masses   Peripheral Vascular Upper Extremity Palpation - Pulses bilaterally normal.  Neurologic Neurologic evaluation reveals -alert and oriented x 3 with no impairment of recent or remote memory. Mental Status-Normal.  Neuropsychiatric The patient's mood and affect are described as -normal. Judgment and Insight-insight is appropriate concerning matters relevant to self.  Musculoskeletal Normal Exam - Left-Upper Extremity Strength Normal and Lower Extremity Strength Normal. Normal Exam - Right-Upper Extremity Strength Normal and Lower Extremity Strength Normal.  Lymphatic Head & Neck  General Head & Neck Lymphatics: Bilateral - Description - Normal. Axillary - Did not examine. Femoral & Inguinal - Did not examine.    Assessment & Plan Randall Hiss M. 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 We discussed the signs & symptoms of incarceration & strangulation. We discussed non-operative and operative management.  The patient has elected to proceed with laparoscopic repair of bilateral inguinal hernias with mesh  I described the procedure in detail. The patient was given educational material. We discussed the risks and benefits including but not limited to bleeding, infection, chronic inguinal pain, nerve entrapment, hernia recurrence, mesh complications, hematoma formation, urinary retention, injury to the testicles, numbness in the groin, blood clots, injury to the surrounding structures, and anesthesia risk. We also discussed the typical post operative recovery course, including no heavy lifting for 4-6 weeks. I explained that the likelihood of improvement of their symptoms is good  We did discuss operating during coronavirus. We discussed that he  would be tested preoperative. He would need to isolate between his tests and surgery. We discussed the importance of social distancing, wearing a mask, washing his hands frequently after surgery in order to avoid contracting coronavirus. We discussed that he would be at increased risk for complications should he contracted perioperatively  Current Plans Pt Education - Pamphlet Given - Laparoscopic Hernia Repair You are being scheduled for surgery- Our schedulers will call you.  You should hear from our office's scheduling department within 5 working days about the location, date, and time of surgery. We try to make accommodations for patient's preferences in scheduling surgery, but sometimes the OR schedule or the surgeon's schedule prevents [place] from making those accommodations.  If you have not heard from our office [phone]) in 5 working days, call the office and ask for your surgeon's nurse.  If you have other questions about your diagnosis, plan, or surgery, call the office and ask for your surgeon's nurse.  Leighton Ruff. Redmond Pulling, MD, FACS General, Bariatric, & Minimally Invasive Surgery [facility], [place],

## 2018-10-24 ENCOUNTER — Ambulatory Visit (HOSPITAL_COMMUNITY): Payer: BC Managed Care – PPO | Admitting: Emergency Medicine

## 2018-10-24 ENCOUNTER — Other Ambulatory Visit: Payer: Self-pay

## 2018-10-24 ENCOUNTER — Encounter (HOSPITAL_COMMUNITY)
Admission: RE | Disposition: A | Discharge status: Home / Self Care | Payer: Self-pay | Source: Other Acute Inpatient Hospital | Attending: General Surgery

## 2018-10-24 ENCOUNTER — Ambulatory Visit (HOSPITAL_COMMUNITY)
Admission: RE | Admit: 2018-10-24 | Discharge: 2018-10-24 | Disposition: A | Discharge status: Home / Self Care | Payer: BC Managed Care – PPO | Source: Other Acute Inpatient Hospital | Attending: General Surgery | Admitting: General Surgery

## 2018-10-24 ENCOUNTER — Encounter (HOSPITAL_COMMUNITY): Payer: Self-pay | Admitting: Emergency Medicine

## 2018-10-24 DIAGNOSIS — D176 Benign lipomatous neoplasm of spermatic cord: Secondary | ICD-10-CM | POA: Insufficient documentation

## 2018-10-24 DIAGNOSIS — M19049 Primary osteoarthritis, unspecified hand: Secondary | ICD-10-CM | POA: Insufficient documentation

## 2018-10-24 DIAGNOSIS — M109 Gout, unspecified: Secondary | ICD-10-CM | POA: Insufficient documentation

## 2018-10-24 DIAGNOSIS — M479 Spondylosis, unspecified: Secondary | ICD-10-CM | POA: Insufficient documentation

## 2018-10-24 DIAGNOSIS — Z79899 Other long term (current) drug therapy: Secondary | ICD-10-CM | POA: Diagnosis not present

## 2018-10-24 DIAGNOSIS — Z91018 Allergy to other foods: Secondary | ICD-10-CM | POA: Insufficient documentation

## 2018-10-24 DIAGNOSIS — G8929 Other chronic pain: Secondary | ICD-10-CM | POA: Insufficient documentation

## 2018-10-24 DIAGNOSIS — Z91013 Allergy to seafood: Secondary | ICD-10-CM | POA: Insufficient documentation

## 2018-10-24 DIAGNOSIS — G4730 Sleep apnea, unspecified: Secondary | ICD-10-CM | POA: Diagnosis not present

## 2018-10-24 DIAGNOSIS — K4020 Bilateral inguinal hernia, without obstruction or gangrene, not specified as recurrent: Secondary | ICD-10-CM | POA: Diagnosis not present

## 2018-10-24 HISTORY — PX: INGUINAL HERNIA REPAIR: SHX194

## 2018-10-24 LAB — BASIC METABOLIC PANEL
Anion gap: 10 (ref 5–15)
BUN: 27 mg/dL — ABNORMAL HIGH (ref 8–23)
CO2: 25 mmol/L (ref 22–32)
Calcium: 9.4 mg/dL (ref 8.9–10.3)
Chloride: 108 mmol/L (ref 98–111)
Creatinine, Ser: 1.08 mg/dL (ref 0.61–1.24)
GFR calc Af Amer: >60 mL/min (ref >60)
GFR calc non Af Amer: >60 mL/min (ref >60)
Glucose, Bld: 114 mg/dL — ABNORMAL HIGH (ref 70–99)
Potassium: 4.1 mmol/L (ref 3.5–5.1)
Sodium: 143 mmol/L (ref 135–145)

## 2018-10-24 SURGERY — REPAIR, HERNIA, INGUINAL, BILATERAL, LAPAROSCOPIC
Anesthesia: General | Site: Abdomen | Laterality: Bilateral

## 2018-10-24 MED ORDER — SCOPOLAMINE 1 MG/3DAYS TD PT72
1.0000 | MEDICATED_PATCH | TRANSDERMAL | Status: DC
  Administered 2018-10-24: 1.5 mg via TRANSDERMAL
  Filled 2018-10-24: qty 1

## 2018-10-24 MED ORDER — ONDANSETRON HCL 4 MG/2ML IJ SOLN
INTRAMUSCULAR | Status: AC
  Filled 2018-10-24: qty 2

## 2018-10-24 MED ORDER — OXYCODONE HCL 5 MG/5ML PO SOLN: 5.0000 mg | Freq: Once | ORAL | Status: AC | PRN

## 2018-10-24 MED ORDER — DEXAMETHASONE SODIUM PHOSPHATE 10 MG/ML IJ SOLN
INTRAMUSCULAR | Status: DC | PRN
  Administered 2018-10-24: 10 mg via INTRAVENOUS

## 2018-10-24 MED ORDER — ACETAMINOPHEN 500 MG PO TABS
1000.0000 mg | ORAL_TABLET | ORAL | Status: AC
  Administered 2018-10-24: 1000 mg via ORAL
  Filled 2018-10-24: qty 2

## 2018-10-24 MED ORDER — BUPIVACAINE-EPINEPHRINE (PF) 0.25% -1:200000 IJ SOLN
INTRAMUSCULAR | Status: AC
  Filled 2018-10-24: qty 60

## 2018-10-24 MED ORDER — PROPOFOL 10 MG/ML IV BOLUS
INTRAVENOUS | Status: AC
  Filled 2018-10-24: qty 20

## 2018-10-24 MED ORDER — DEXAMETHASONE SODIUM PHOSPHATE 10 MG/ML IJ SOLN
INTRAMUSCULAR | Status: AC
  Filled 2018-10-24: qty 1

## 2018-10-24 MED ORDER — OXYCODONE HCL 5 MG PO TABS
ORAL_TABLET | ORAL | Status: AC
  Filled 2018-10-24: qty 1

## 2018-10-24 MED ORDER — PROPOFOL 10 MG/ML IV BOLUS
INTRAVENOUS | Status: DC | PRN
  Administered 2018-10-24: 120 mg via INTRAVENOUS

## 2018-10-24 MED ORDER — KETOROLAC TROMETHAMINE 30 MG/ML IJ SOLN
INTRAMUSCULAR | Status: AC
  Filled 2018-10-24: qty 1

## 2018-10-24 MED ORDER — ROCURONIUM BROMIDE 10 MG/ML (PF) SYRINGE
PREFILLED_SYRINGE | INTRAVENOUS | Status: AC
  Filled 2018-10-24: qty 10

## 2018-10-24 MED ORDER — ACETAMINOPHEN 10 MG/ML IV SOLN: 1000.0000 mg | Freq: Once | INTRAVENOUS | Status: DC | PRN

## 2018-10-24 MED ORDER — ROCURONIUM BROMIDE 10 MG/ML (PF) SYRINGE
PREFILLED_SYRINGE | INTRAVENOUS | Status: DC | PRN
  Administered 2018-10-24 (×2): 20 mg via INTRAVENOUS
  Administered 2018-10-24: 50 mg via INTRAVENOUS
  Administered 2018-10-24: 20 mg via INTRAVENOUS

## 2018-10-24 MED ORDER — OXYCODONE HCL 5 MG PO TABS: 5.0000 mg | ORAL_TABLET | Freq: Four times a day (QID) | ORAL | 0 refills | Status: DC | PRN

## 2018-10-24 MED ORDER — LACTATED RINGERS IV SOLN
INTRAVENOUS | Status: DC
  Administered 2018-10-24 (×2): via INTRAVENOUS

## 2018-10-24 MED ORDER — CHLORHEXIDINE GLUCONATE CLOTH 2 % EX PADS: 6.0000 | MEDICATED_PAD | Freq: Once | CUTANEOUS | Status: DC

## 2018-10-24 MED ORDER — ACETAMINOPHEN 160 MG/5ML PO SOLN: 1000.0000 mg | Freq: Once | ORAL | Status: DC | PRN

## 2018-10-24 MED ORDER — 0.9 % SODIUM CHLORIDE (POUR BTL) OPTIME
TOPICAL | Status: DC | PRN
  Administered 2018-10-24: 1000 mL

## 2018-10-24 MED ORDER — GABAPENTIN 300 MG PO CAPS
300.0000 mg | ORAL_CAPSULE | ORAL | Status: AC
  Administered 2018-10-24: 300 mg via ORAL
  Filled 2018-10-24: qty 1

## 2018-10-24 MED ORDER — BUPIVACAINE-EPINEPHRINE 0.25% -1:200000 IJ SOLN
INTRAMUSCULAR | Status: DC | PRN
  Administered 2018-10-24: 60 mL

## 2018-10-24 MED ORDER — KETOROLAC TROMETHAMINE 15 MG/ML IJ SOLN
INTRAMUSCULAR | Status: DC | PRN
  Administered 2018-10-24: 15 mg via INTRAVENOUS

## 2018-10-24 MED ORDER — ACETAMINOPHEN 500 MG PO TABS: 1000.0000 mg | ORAL_TABLET | Freq: Once | ORAL | Status: DC | PRN

## 2018-10-24 MED ORDER — ONDANSETRON HCL 4 MG/2ML IJ SOLN
INTRAMUSCULAR | Status: DC | PRN
  Administered 2018-10-24: 4 mg via INTRAVENOUS

## 2018-10-24 MED ORDER — FENTANYL CITRATE (PF) 100 MCG/2ML IJ SOLN: 25.0000 ug | INTRAMUSCULAR | Status: DC | PRN

## 2018-10-24 MED ORDER — LIDOCAINE HCL (CARDIAC) PF 100 MG/5ML IV SOSY
PREFILLED_SYRINGE | INTRAVENOUS | Status: DC | PRN
  Administered 2018-10-24: 60 mg via INTRAVENOUS

## 2018-10-24 MED ORDER — LIDOCAINE 2% (20 MG/ML) 5 ML SYRINGE
INTRAMUSCULAR | Status: AC
  Filled 2018-10-24: qty 5

## 2018-10-24 MED ORDER — CEFAZOLIN SODIUM-DEXTROSE 2-4 GM/100ML-% IV SOLN
2.0000 g | INTRAVENOUS | Status: AC
  Administered 2018-10-24: 2 g via INTRAVENOUS
  Filled 2018-10-24: qty 100

## 2018-10-24 MED ORDER — OXYCODONE HCL 5 MG PO TABS
5.0000 mg | ORAL_TABLET | Freq: Once | ORAL | Status: AC | PRN
  Administered 2018-10-24: 5 mg via ORAL

## 2018-10-24 MED ORDER — FENTANYL CITRATE (PF) 100 MCG/2ML IJ SOLN
INTRAMUSCULAR | Status: DC | PRN
  Administered 2018-10-24: 100 ug via INTRAVENOUS
  Administered 2018-10-24 (×2): 50 ug via INTRAVENOUS

## 2018-10-24 MED ORDER — SUGAMMADEX SODIUM 200 MG/2ML IV SOLN
INTRAVENOUS | Status: AC
  Filled 2018-10-24: qty 2

## 2018-10-24 MED ORDER — FENTANYL CITRATE (PF) 250 MCG/5ML IJ SOLN
INTRAMUSCULAR | Status: AC
  Filled 2018-10-24: qty 5

## 2018-10-24 MED ORDER — SUGAMMADEX SODIUM 200 MG/2ML IV SOLN
INTRAVENOUS | Status: DC | PRN
  Administered 2018-10-24: 200 mg via INTRAVENOUS

## 2018-10-24 SURGICAL SUPPLY — 36 items
APPLIER CLIP 5 13 M/L LIGAMAX5 (MISCELLANEOUS)
BENZOIN TINCTURE PRP APPL 2/3 (GAUZE/BANDAGES/DRESSINGS) ×2 IMPLANT
BNDG ADH 1X3 SHEER STRL LF (GAUZE/BANDAGES/DRESSINGS) ×3 IMPLANT
CABLE HIGH FREQUENCY MONO STRZ (ELECTRODE) ×2 IMPLANT
CHLORAPREP W/TINT 26 (MISCELLANEOUS) ×2 IMPLANT
CLIP APPLIE 5 13 M/L LIGAMAX5 (MISCELLANEOUS) IMPLANT
COVER SURGICAL LIGHT HANDLE (MISCELLANEOUS) ×2 IMPLANT
COVER WAND RF STERILE (DRAPES) IMPLANT
DECANTER SPIKE VIAL GLASS SM (MISCELLANEOUS) ×3 IMPLANT
DEVICE SECURE STRAP 25 ABSORB (INSTRUMENTS) ×1 IMPLANT
ELECT PENCIL ROCKER SW 15FT (MISCELLANEOUS) IMPLANT
ELECT REM PT RETURN 15FT ADLT (MISCELLANEOUS) ×2 IMPLANT
GLOVE BIO SURGEON STRL SZ7.5 (GLOVE) ×2 IMPLANT
GLOVE INDICATOR 8.0 STRL GRN (GLOVE) ×2 IMPLANT
GOWN STRL REUS W/TWL XL LVL3 (GOWN DISPOSABLE) ×6 IMPLANT
KIT BASIN OR (CUSTOM PROCEDURE TRAY) ×2 IMPLANT
KIT TURNOVER KIT A (KITS) IMPLANT
L-HOOK LAP DISP 36CM (ELECTROSURGICAL)
LHOOK LAP DISP 36CM (ELECTROSURGICAL) IMPLANT
MESH 3DMAX 4X6 LT LRG (Mesh General) ×2 IMPLANT
MESH 3DMAX 4X6 RT LRG (Mesh General) ×2 IMPLANT
PAD POSITIONING PINK XL (MISCELLANEOUS) ×2 IMPLANT
PENCIL SMOKE EVACUATOR (MISCELLANEOUS) IMPLANT
SCISSORS LAP 5X35 DISP (ENDOMECHANICALS) ×2 IMPLANT
SET IRRIG TUBING LAPAROSCOPIC (IRRIGATION / IRRIGATOR) IMPLANT
SET TUBE SMOKE EVAC HIGH FLOW (TUBING) ×1 IMPLANT
SLEEVE XCEL OPT CAN 5 100 (ENDOMECHANICALS) ×2 IMPLANT
STRIP CLOSURE SKIN 1/2X4 (GAUZE/BANDAGES/DRESSINGS) ×2 IMPLANT
SUT MNCRL AB 4-0 PS2 18 (SUTURE) ×2 IMPLANT
SUT NOVA NAB DX-16 0-1 5-0 T12 (SUTURE) IMPLANT
SUT NOVA NAB GS-21 0 18 T12 DT (SUTURE) IMPLANT
TOWEL OR 17X26 10 PK STRL BLUE (TOWEL DISPOSABLE) ×2 IMPLANT
TOWEL OR NON WOVEN STRL DISP B (DISPOSABLE) ×2 IMPLANT
TRAY LAPAROSCOPIC (CUSTOM PROCEDURE TRAY) ×2 IMPLANT
TROCAR BLADELESS OPT 5 100 (ENDOMECHANICALS) ×2 IMPLANT
TROCAR XCEL BLUNT TIP 100MML (ENDOMECHANICALS) ×2 IMPLANT

## 2018-10-24 NOTE — H&P (Signed)
Jeffrey Li is an 68 y.o. male.   Chief Complaint: here for surgery HPI: 68 yo here for surgery for b/l inguinal hernia repair. Denies any medical changes since seen.   The patient is a 68 year old male who presents with an inguinal hernia. I was asked to see this patient by my partner since he has bilateral hernias and would probably benefit from a laparoscopic approach as opposed to a bilateral inguinal incision. My partner or any documented a complete history and physical note. Briefly the patient is had a right groin hernia for quite some time and recently noticed to have swelling in his left groin and underwent a CT scan that confirmed bilateral inguinal hernias. A section of sigmoid colon which was nonobstructed was in the left inguinal hernia. He is referred here for evaluation. He denies any prior abdominal surgery. He is not on blood thinners. He does have discomfort in the groins mainly if he is doing some stretching activities like crossing over his leg. He feels like it is compressing sensation. He denies any burning, shooting or stabbing pain. He is a bilateral vasectomy. He denies any chest pain, chest pressure, source of breath, dyspnea on exertion. He does endorse some nocturia  Past Medical History:  Diagnosis Date  . 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 Chronic low back pain   . Complication of anesthesia 1970   "TOO MUCH ANESTHETIC AFTER TRASHING AROUND HAD ANESTHESIA OVERDOSE"  . Erectile dysfunction   . GERD (gastroesophageal reflux disease)   . Gout   . Migraine headache    NONE IN 20 YEARS  . Peripheral neuropathy 07/03/2017   TOES AND FEET  . Sleep apnea    NONE WITH LATEST TEST 20-19    Past Surgical History:  Procedure Laterality Date  . ERCP  03/14/2011   Procedure: ENDOSCOPIC RETROGRADE CHOLANGIOPANCREATOGRAPHY (ERCP);  Surgeon: Landry Dyke, MD;  Location: [facility];  Service: Endoscopy;  Laterality: N/A;  . left knee  1986   SCOPE  .  RETINAL TEAR REPAIR CRYOTHERAPY Right 2016  . right shoulder  1970   Repair of torn AC joint right shoulder  . SPHINCTEROTOMY  03/14/2011   Procedure: SPHINCTEROTOMY;  Surgeon: Landry Dyke, MD;  Location: [facility];  Service: Endoscopy;  Laterality: N/A;  . SURGICAL REPAIR ZYGOMATIC ARCH  1977 OR 1978    Family History  Problem Relation Age of Onset  . Stroke Mother   . Prostate cancer Father   . Glaucoma Father   . Kidney failure Father   . Thyroid disease Sister   . Diverticulitis Sister   . Thyroid disease Sister   . Thyroid disease Sister   . Migraines Brother   . Colon cancer Neg Hx    Social History:  reports that he has never smoked. He has never used smokeless tobacco. He reports current alcohol use. He reports that he does not use drugs.  Allergies:  Allergies  Allergen Reactions  . Other     Melons including cucumbers-stomach  upset  . Shellfish Allergy Nausea And Vomiting    mollusk    Medications Prior to Admission  Medication Sig Dispense Refill  . acetaminophen (TYLENOL) 500 MG tablet Take 500 mg by mouth every 6 (six) hours as needed.    Marland Kitchen allopurinol (ZYLOPRIM) 300 MG tablet Take 150 mg by mouth at bedtime.     . Alpha-Lipoic Acid 300 MG CAPS Take 2 capsules by mouth daily.    Marland Kitchen  B COMPLEX-BIOTIN-FA PO Take 2 capsules by mouth daily.    . Coenzyme Q10 (CO Q 10 PO) Take 400 mg by mouth daily.     . fluocinonide ointment (LIDEX) 7.32 % Apply 1 application topically every 14 (fourteen) days.     Marland Kitchen loratadine (CLARITIN) 10 MG tablet Take 10 mg by mouth daily as needed.     Marland Kitchen MAGNESIUM-CALCIUM-FOLIC ACID PO Take 202 mg by mouth daily.     . Multiple Vitamins-Minerals (MULTIVITAMINS THER. W/MINERALS) TABS Take 1 tablet by mouth daily.      . Omega-3 Fatty Acids (OMEGA-3 FISH OIL PO) Take 720 mg by mouth daily.     . sildenafil (REVATIO) 20 MG tablet Take 20 mg by mouth as needed. Take 2-5 tablets when needed    . VITAMIN E PO Take 2 capsules by mouth  daily. 450mg - 2 capsules daily    . ibuprofen (ADVIL,MOTRIN) 200 MG tablet Take 200 mg by mouth 2 (two) times daily.       Results for orders placed or performed during the hospital encounter of 10/24/18 (from the past 48 hour(s))  Basic metabolic panel     Status: Abnormal   Collection Time: 10/24/18  9:42 AM  Result Value Ref Range   Sodium 143 135 - 145 mmol/L   Potassium 4.1 3.5 - 5.1 mmol/L   Chloride 108 98 - 111 mmol/L   CO2 25 22 - 32 mmol/L   Glucose, Bld 114 (H) 70 - 99 mg/dL   BUN 27 (H) 8 - 23 mg/dL   Creatinine, Ser 1.08 0.61 - 1.24 mg/dL   Calcium 9.4 8.9 - 10.3 mg/dL   GFR calc non Af Amer >60 >60 mL/min   GFR calc Af Amer >60 >60 mL/min   Anion gap 10 5 - 15    Comment: Performed at [facility], [place] [address] [place],  [address]   No results found.  Review of Systems  All other systems reviewed and are negative.   Blood pressure 125/78, pulse 87, temperature 98 F (36.7 C), temperature source Oral, resp. rate 18, height 6\' 1"  (1.854 m), weight 99.4 kg, SpO2 98 %. Physical Exam  Vitals reviewed. Constitutional: He is oriented to person, place, and time. He appears well-developed and well-nourished. No distress.  HENT:  Head: Normocephalic and atraumatic.  Right Ear: External ear normal.  Left Ear: External ear normal.  Eyes: Conjunctivae are normal. No scleral icterus.  Neck: Normal range of motion. Neck supple. No tracheal deviation present. No thyromegaly present.  Cardiovascular: Normal rate and normal heart sounds.  Respiratory: Effort normal and breath sounds normal. No stridor. No respiratory distress. He has no wheezes.  GI: Soft. He exhibits no distension. There is no abdominal tenderness. A hernia is present. Hernia confirmed positive in the right inguinal area and confirmed positive in the left inguinal area.  Musculoskeletal:        General: No tenderness or edema.  Lymphadenopathy:    He has no cervical adenopathy.   Neurological: He is alert and oriented to person, place, and time. He exhibits normal muscle tone.  Skin: Skin is warm and dry. No rash noted. He is not diaphoretic. No erythema. No pallor.  Psychiatric: He has a normal mood and affect. His behavior is normal. Judgment and thought content normal.     Assessment/Plan B/l inguinal hernias  ERAS protocol To OR for lap b/l inguinal hernia repair All questions asked and answered  Greer Pickerel, MD 10/24/2018,  10:26 AM

## 2018-10-24 NOTE — Discharge Instructions (Signed)
[facility] °LAPAROSCOPIC SURGERY: POST OP INSTRUCTIONS °Always review your discharge instruction sheet given to you by the facility where your surgery was performed. °IF YOU HAVE DISABILITY OR FAMILY LEAVE FORMS, YOU MUST BRING THEM TO THE OFFICE FOR PROCESSING.   °DO NOT GIVE THEM TO YOUR DOCTOR. ° °PAIN CONTROL ° °1. First take acetaminophen (Tylenol) AND/or ibuprofen (Advil) to control your pain after surgery.  Follow directions on package.  Taking acetaminophen (Tylenol) and/or ibuprofen (Advil) regularly after surgery will help to control your pain and lower the amount of prescription pain medication you may need.  You should not take more than 3,000 mg (3 grams) of acetaminophen (Tylenol) in 24 hours.  You should not take ibuprofen (Advil), aleve, motrin, naprosyn or other NSAIDS if you have a history of stomach ulcers or chronic kidney disease.  °2. A prescription for pain medication may be given to you upon discharge.  Take your pain medication as prescribed, if you still have uncontrolled pain after taking acetaminophen (Tylenol) or ibuprofen (Advil). °3. Use ice packs to help control pain. °4. If you need a refill on your pain medication, please contact your pharmacy.  They will contact our office to request authorization. Prescriptions will not be filled after 5pm or on week-ends. ° °HOME MEDICATIONS °5. Take your usually prescribed medications unless otherwise directed. ° °DIET °6. You should follow a light diet the first few days after arrival home.  Be sure to include lots of fluids daily. Avoid fatty, fried foods.  ° °CONSTIPATION °7. It is common to experience some constipation after surgery and if you are taking pain medication.  Increasing fluid intake and taking a stool softener (such as Colace) will usually help or prevent this problem from occurring.  A mild laxative (Milk of Magnesia or Miralax) should be taken according to package instructions if there are no bowel  movements after 48 hours. ° °WOUND/INCISION CARE °8. Most patients will experience some swelling and bruising in the area of the incisions.  Ice packs will help.  Swelling and bruising can take several days to resolve.  °9. Unless discharge instructions indicate otherwise, follow guidelines below  °a. STERI-STRIPS - you may remove your outer bandages 48 hours after surgery, and you may shower at that time.  You have steri-strips (small skin tapes) in place directly over the incision.  These strips should be left on the skin for 7-10 days.   °b. DERMABOND/SKIN GLUE - you may shower in 24 hours.  The glue will flake off over the next 2-3 weeks. °10. Any sutures or staples will be removed at the office during your follow-up visit. ° °ACTIVITIES °11. You may resume regular (light) daily activities beginning the next day--such as daily self-care, walking, climbing stairs--gradually increasing activities as tolerated.  You may have sexual intercourse when it is comfortable.  Refrain from any heavy lifting or straining until approved by your doctor. °a. You may drive when you are no longer taking prescription pain medication, you can comfortably wear a seatbelt, and you can safely maneuver your car and apply brakes. ° °FOLLOW-UP °12. You should see your doctor in the office for a follow-up appointment approximately 2-3 weeks after your surgery.  You should have been given your post-op/follow-up appointment when your surgery was scheduled.  If you did not receive a post-op/follow-up appointment, make sure that you call for this appointment within a day or two after you arrive home to insure a convenient appointment time. ° °OTHER   INSTRUCTIONS 13. DO NOT LIFT, PUSH, OR PULL ANYTHING GREATER THAN 10 LBS FOR 1 MONTH  WHEN TO CALL YOUR DOCTOR: 1. Fever over 101.0 2. Inability to urinate 3. Continued bleeding from incision. 4. Increased pain, redness, or drainage from the incision. 5. Increasing abdominal pain  The  clinic staff is available to answer your questions during regular business hours.  Please dont hesitate to call and ask to speak to one of the nurses for clinical concerns.  If you have a medical emergency, go to the nearest emergency room or call 911.  A surgeon from [facility] is always on call at the hospital. [address] [place], [place], [place]  [address] ? [address], [place], [place]   [phone] ? [phone] ? FAX [phone] Web site: [email or web address]     Managing Your Pain After Surgery Without Opioids    Thank you for participating in our program to help patients manage their pain after surgery without opioids. This is part of our effort to provide you with the best care possible, without exposing you or your family to the risk that opioids pose.  What pain can I expect after surgery? You can expect to have some pain after surgery. This is normal. The pain is typically worse the day after surgery, and quickly begins to get better. Many studies have found that many patients are able to manage their pain after surgery with Over-the-Counter (OTC) medications such as Tylenol and Motrin. If you have a condition that does not allow you to take Tylenol or Motrin, notify your surgical team.  How will I manage my pain? The best strategy for controlling your pain after surgery is around the clock pain control with Tylenol (acetaminophen) and Motrin (ibuprofen or Advil). Alternating these medications with each other allows you to maximize your pain control. In addition to Tylenol and Motrin, you can use heating pads or ice packs on your incisions to help reduce your pain.  How will I alternate your regular strength over-the-counter pain medication? You will take a dose of pain medication every three hours. ; Start by taking 650 mg of Tylenol (2 pills of 325 mg) ; 3 hours later take 600 mg of Motrin (3 pills of 200 mg) ; 3  hours after taking the Motrin take 650 mg of Tylenol ; 3 hours after that take 600 mg of Motrin.   - 1 -  See example - if your first dose of Tylenol is at 12:00 PM   12:00 PM Tylenol 650 mg (2 pills of 325 mg)  3:00 PM Motrin 600 mg (3 pills of 200 mg)  6:00 PM Tylenol 650 mg (2 pills of 325 mg)  9:00 PM Motrin 600 mg (3 pills of 200 mg)  Continue alternating every 3 hours   We recommend that you follow this schedule around-the-clock for at least 3 days after surgery, or until you feel that it is no longer needed. Use the table on the last page of this handout to keep track of the medications you are taking. Important: Do not take more than 3000mg  of Tylenol or 2300mg  of Motrin in a 24-hour period. Do not take ibuprofen/Motrin if you have a history of bleeding stomach ulcers, severe kidney disease, &/or actively taking a blood thinner  What if I still have pain? If you have pain that is not controlled with the over-the-counter pain medications (Tylenol and Motrin or Advil) you might have what we call breakthrough  pain. You will receive a prescription for a small amount of an opioid pain medication such as Oxycodone, Tramadol, or Tylenol with Codeine. Use these opioid pills in the first 24 hours after surgery if you have breakthrough pain. Do not take more than 1 pill every 4-6 hours.  If you still have uncontrolled pain after using all opioid pills, don't hesitate to call our staff using the number provided. We will help make sure you are managing your pain in the best way possible, and if necessary, we can provide a prescription for additional pain medication.   Day 1    Time  Name of Medication Number of pills taken  Amount of Acetaminophen  Pain Level   Comments  AM PM       AM PM       AM PM       AM PM       AM PM       AM PM       AM PM       AM PM       Total Daily amount of Acetaminophen Do not take more than  3,000 mg per day      Day 2    Time  Name of  Medication Number of pills taken  Amount of Acetaminophen  Pain Level   Comments  AM PM       AM PM       AM PM       AM PM       AM PM       AM PM       AM PM       AM PM       Total Daily amount of Acetaminophen Do not take more than  3,000 mg per day      Day 3    Time  Name of Medication Number of pills taken  Amount of Acetaminophen  Pain Level   Comments  AM PM       AM PM       AM PM       AM PM          AM PM       AM PM       AM PM       AM PM       Total Daily amount of Acetaminophen Do not take more than  3,000 mg per day      Day 4    Time  Name of Medication Number of pills taken  Amount of Acetaminophen  Pain Level   Comments  AM PM       AM PM       AM PM       AM PM       AM PM       AM PM       AM PM       AM PM       Total Daily amount of Acetaminophen Do not take more than  3,000 mg per day      Day 5    Time  Name of Medication Number of pills taken  Amount of Acetaminophen  Pain Level   Comments  AM PM       AM PM       AM PM       AM PM       AM PM  AM PM       AM PM       AM PM       Total Daily amount of Acetaminophen Do not take more than  3,000 mg per day       Day 6    Time  Name of Medication Number of pills taken  Amount of Acetaminophen  Pain Level  Comments  AM PM       AM PM       AM PM       AM PM       AM PM       AM PM       AM PM       AM PM       Total Daily amount of Acetaminophen Do not take more than  3,000 mg per day      Day 7    Time  Name of Medication Number of pills taken  Amount of Acetaminophen  Pain Level   Comments  AM PM       AM PM       AM PM       AM PM       AM PM       AM PM       AM PM       AM PM       Total Daily amount of Acetaminophen Do not take more than  3,000 mg per day        For additional information about how and where to safely dispose of unused opioid medications - https://www.morepowerfulnc.org  Disclaimer: This  document contains information and/or instructional materials adapted from [facility] for the typical patient with your condition. It does not replace medical advice from your health care provider because your experience may differ from that of the typical patient. Talk to your health care provider if you have any questions about this document, your condition or your treatment plan. Adapted from [facility]   

## 2018-10-24 NOTE — Anesthesia Procedure Notes (Signed)
Procedure Name: Intubation Date/Time: 10/24/2018 10:50 AM Performed by: Glory Buff, CRNA Pre-anesthesia Checklist: Patient identified, Emergency Drugs available, Suction available and Patient being monitored Patient Re-evaluated:Patient Re-evaluated prior to induction Oxygen Delivery Method: Circle system utilized Preoxygenation: Pre-oxygenation with 100% oxygen Induction Type: IV induction Ventilation: Mask ventilation without difficulty Laryngoscope Size: Miller and 3 Grade View: Grade II Tube type: Oral Tube size: 7.5 mm Number of attempts: 1 Airway Equipment and Method: Stylet and Oral airway Placement Confirmation: ETT inserted through vocal cords under direct vision,  positive ETCO2 and breath sounds checked- equal and bilateral Secured at: 23 cm Tube secured with: Tape Dental Injury: Teeth and Oropharynx as per pre-operative assessment

## 2018-10-24 NOTE — Transfer of Care (Signed)
Immediate Anesthesia Transfer of Care Note  Patient: Jeffrey Li  Procedure(s) Performed: LAPAROSCOPIC BILATERAL INGUINAL HERNIAS REPAIR WITH MESH (Bilateral Abdomen)  Patient Location: PACU  Anesthesia Type:General  Level of Consciousness: drowsy, patient cooperative and responds to stimulation  Airway & Oxygen Therapy: Patient Spontanous Breathing and Patient connected to face mask oxygen  Post-op Assessment: Report given to RN and Post -op Vital signs reviewed and stable  Post vital signs: Reviewed and stable  Last Vitals:  Vitals Value Taken Time  BP    Temp    Pulse    Resp    SpO2      Last Pain:  Vitals:   10/24/18 1012  TempSrc: Oral  PainSc:       Patients Stated Pain Goal: 4 (57/26/20 3559)  Complications: No apparent anesthesia complications

## 2018-10-24 NOTE — Op Note (Signed)
10/24/2018  Arnetha Massy 10-Jul-1950   PREOPERATIVE DIAGNOSIS: bilateral inguinal hernias.   POSTOPERATIVE DIAGNOSIS: bilateral indirect inguinal hernias  PROCEDURE: Laparoscopic repair of bilateral indirect inguinal hernias with  mesh (TAPP).   SURGEON: Leighton Ruff. Redmond Pulling, MD   ASSISTANT SURGEON: None.   ANESTHESIA: General plus local consisting of 0.25% Marcaine with epi.   ESTIMATED BLOOD LOSS: Minimal.   FINDINGS: The patient had bilateral indirect inguinal hernias. 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  It was repaired with 2 pieces of large bard large 3d max mesh  SPECIMEN: cord lipoma - discarded  INDICATIONS FOR PROCEDURE: 68 year old male presented with bilateral inguinal hernias who presented for surgical repair The risks and benefits including but not limited to bleeding, infection, chronic inguinal pain, nerve entrapment, hernia recurrence, mesh complications, hematoma formation, urinary retention, injury to the testicles or the ovaries, numbness in the groin, blood clots, injury to the surrounding structures, and anesthesia risk was discussed with the patient.  DESCRIPTION OF PROCEDURE: After obtaining verbal consent, the patient was then taken back to the operating room, placed  supine on the operating room table. General endotracheal anesthesia was  established. The patient had emptied their bladder prior to going back to  the operating room. Sequential compression devices were placed. The  abdomen and groin were prepped and draped in the usual standard surgical  fashion with ChloraPrep. The patient received oral gabapentin & Tylenol as well as IV  antibiotics prior to the incision. A surgical time-out was performed.  Local was infiltrated at the base of the umbilicus.   Next, a 1-cm vertical infraumbilical incision was made with a #11 blade. The fascia  was grasped and lifted anteriorly. Next, the fascia was  incised, and  the abdominal cavity was entered. Pursestring suture was placed around  the fascial edges using a 0 Vicryl. A 12-mm Hasson trocar was placed.  Pneumoperitoneum was smoothly established up to a patient pressure of 15  mmHg. Laparoscope was advanced. There was evidence of bilateral indirect inguinal hernias (L>R)  hernia. The patient had a defect lateral to  the inferior epigastric vessel, consistent with bilateral  indirect  hernias. Two 5-mm trocars were placed, one on the right, one on the left  in the midclavicular line slightly above the level of the umbilicus all  under direct visualization. After local had been infiltrated, I then  made incision along the peritoneum on the right, starting 2 inches above  the anterior superior iliac spine and caring it medial  toward the median umbilical ligament in a lazy S configuration using  Endo Shears with electrocautery. The peritoneal flap was then gently  dissected downward from the anterior abdominal wall taking care not to  injure the inferior epigastric vessels. The pubic bone was identified.  The testicular vessels were identified.  Using  traction and counter traction with short graspers, I reduced the sac in  its entirety. The testicular vessels had been identified and preserved. The vas deferens was identified and preserved, and the hernia sac was stripped from those to  surrounding structures. The patient had a very large cord lipoma that was reduced and separated from the cord structures and extracted from the abdomen.  I then went about creating a large pocket by  lifting the peritoneum of the pelvic floor. I took great care not to  injure the iliac vessels.    Local anesthetic was injected 2 finger breadths below  and medial to the anterior superior iliac spine as well as along the right groin prior to placing the mesh. I then obtained a piece of large bard 3d max mesh, placed it through the Hasson trocar, half of it covered  medial  to the inferior epigastric vessels and half of it lateral to the  inferior epigastric vessels. The defect was well  covered with the mesh. I then secured the mesh to the abdominal wall  using an Ethicon secure strap tack. Tacks were placed through  the Cooper's ligament, one tack on each side of the inferior epigastric  vessel.   I then turned my attention to the left groin and switched sides with the CST. After local had been infiltrated, I then  made incision along the peritoneum on the right, starting 2 inches above  the anterior superior iliac spine and caring it medial  toward the median umbilical ligament in a lazy S configuration using  Endo Shears with electrocautery. The peritoneal flap was then gently  dissected downward from the anterior abdominal wall taking care not to  injure the inferior epigastric vessels. The pubic bone was identified.  The testicular vessels were identified.  Using  traction and counter traction with short graspers, I reduced the sac in  its entirety. The testicular vessels had been identified and preserved. The vas deferens was identified and preserved, and the hernia sac was stripped from those to  surrounding structures.   I then went about creating a large pocket by  lifting the peritoneum of the pelvic floor. I took great care not to  injure the iliac vessels.    Local anesthetic was injected 2 finger breadths below and medial to the anterior superior iliac spine as well as along the left groin prior to placing the mesh.  I then obtained a piece of large bard 3d max mesh, placed it through the Hasson trocar, half of it covered medial  to the inferior epigastric vessels and half of it lateral to the  inferior epigastric vessels. The defect was well  covered with the mesh. I then secured the mesh to the abdominal wall  using an Ethicon secure strap tack. Tacks were placed through  the Cooper's ligament, one tack on each side of the inferior  epigastric  vessel.    No tacks were placed below the shelving edge of the inguinal ligament. Pneumoperitoneum was reduced  to 8 mmHg. I then brought the peritoneal flap back up to the abdominal  wall and tacked it to the abdominal wall using 2 tacks on the right and 3 on the left. There was no  defect in the peritoneum, and the mesh was well covered. I removed the  Hasson trocar and tied down the previously placed pursestring suture.  The closure was viewed laparoscopically. There was no evidence of  fascial defect. There was no air leak at the umbilicus. There was no  evidence of injury to surrounding structures. Pneumoperitoneum was  released, and the remaining trocars were removed. All skin incisions  were closed with a 4-0 Monocryl in a subcuticular fashion followed by  application of benzoin, steri-strips and bandages. All needle, instrument, and sponge counts  were correct x2. There are no immediate complications. The patient  tolerated the procedure well. The patient was extubated and taken to the  recovery room in stable condition.   Leighton Ruff. Redmond Pulling, MD, FACS General, Bariatric, & Minimally Invasive Surgery [facility], [place]

## 2018-10-25 ENCOUNTER — Encounter (HOSPITAL_COMMUNITY): Payer: Self-pay | Admitting: General Surgery

## 2018-10-28 NOTE — Anesthesia Postprocedure Evaluation (Signed)
Anesthesia Post Note  Patient: Jeffrey Li  Procedure(s) Performed: LAPAROSCOPIC BILATERAL INGUINAL HERNIAS REPAIR WITH MESH (Bilateral Abdomen)     Patient location during evaluation: PACU Anesthesia Type: General Level of consciousness: awake and alert Pain management: pain level controlled Vital Signs Assessment: post-procedure vital signs reviewed and stable Respiratory status: spontaneous breathing, nonlabored ventilation, respiratory function stable and patient connected to nasal cannula oxygen Cardiovascular status: blood pressure returned to baseline and stable Postop Assessment: no apparent nausea or vomiting Anesthetic complications: no    Last Vitals:  Vitals:   10/24/18 1300 10/24/18 1315  BP: 123/88 121/82  Pulse: 72 79  Resp: 17 16  Temp:    SpO2: 92% 94%    Last Pain:  Vitals:   10/24/18 1234  TempSrc:   PainSc: 0-No pain                 Jissell Trafton

## 2019-09-04 ENCOUNTER — Other Ambulatory Visit: Payer: Self-pay | Admitting: Gastroenterology

## 2019-09-04 DIAGNOSIS — R1310 Dysphagia, unspecified: Secondary | ICD-10-CM

## 2019-09-15 ENCOUNTER — Ambulatory Visit
Admission: RE | Admit: 2019-09-15 | Discharge: 2019-09-15 | Disposition: A | Discharge status: Home / Self Care | Payer: BC Managed Care – PPO | Source: Ambulatory Visit | Attending: Gastroenterology | Admitting: Gastroenterology

## 2019-09-15 DIAGNOSIS — R1310 Dysphagia, unspecified: Secondary | ICD-10-CM

## 2020-01-22 ENCOUNTER — Other Ambulatory Visit: Payer: Self-pay

## 2020-01-22 ENCOUNTER — Ambulatory Visit: Payer: BC Managed Care – PPO | Attending: General Surgery | Admitting: Physical Therapy

## 2020-01-22 ENCOUNTER — Encounter: Payer: Self-pay | Admitting: Physical Therapy

## 2020-01-22 DIAGNOSIS — M6281 Muscle weakness (generalized): Secondary | ICD-10-CM | POA: Diagnosis present

## 2020-01-22 DIAGNOSIS — R1031 Right lower quadrant pain: Secondary | ICD-10-CM

## 2020-01-22 NOTE — Patient Instructions (Signed)
Access Code: [email or web address] URL: [email or web address] Date: 01/22/2020 Prepared by: Hilda Blades  Exercises Supine Active Straight Leg Raise - 1 x daily - 3-4 x weekly - 2-3 sets - 10 reps Supine Bridge with Mini Swiss Ball Between Knees - 1 x daily - 3-4 x weekly - 2-3 sets - 10 reps - 3-5 seconds hold Supine Dead Bug with Leg Extension - 1 x daily - 3-4 x weekly - 3-4 sets - 10 reps Sidelying Hip Abduction - 1 x daily - 3-4 x weekly - 2-3 sets - 10 reps

## 2020-01-23 ENCOUNTER — Encounter: Payer: Self-pay | Admitting: Physical Therapy

## 2020-01-23 NOTE — Therapy (Signed)
[place], [place], [address] Phone: [phone]   Fax:  [phone]  Physical Therapy Evaluation  Patient Details  Name: Jeffrey Li MRN: [id number] Date of Birth: 1950/10/09 Referring Provider (PT): Greer Pickerel, MD   Encounter Date: 01/22/2020   PT End of Session - 01/22/20 1657    Visit Number 1    Number of Visits 6    Date for PT Re-Evaluation 03/04/20    Authorization Type BCBS    Authorization Time Period FOTO by 6th visit    PT Start Time 1700    PT Stop Time 1745    PT Time Calculation (min) 45 min    Activity Tolerance Patient tolerated treatment well    Behavior During Therapy [facility] for tasks assessed/performed           Past Medical History:  Diagnosis Date  . 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 Chronic low back pain   . Complication of anesthesia 1970   "TOO MUCH ANESTHETIC AFTER TRASHING AROUND HAD ANESTHESIA OVERDOSE"  . Erectile dysfunction   . GERD (gastroesophageal reflux disease)   . Gout   . Migraine headache    NONE IN 20 YEARS  . Peripheral neuropathy 07/03/2017   TOES AND FEET  . Sleep apnea    NONE WITH LATEST TEST 20-19    Past Surgical History:  Procedure Laterality Date  . ERCP  03/14/2011   Procedure: ENDOSCOPIC RETROGRADE CHOLANGIOPANCREATOGRAPHY (ERCP);  Surgeon: Landry Dyke, MD;  Location: [facility];  Service: Endoscopy;  Laterality: N/A;  . INGUINAL HERNIA REPAIR Bilateral 10/24/2018   Procedure: LAPAROSCOPIC BILATERAL INGUINAL HERNIAS REPAIR WITH MESH;  Surgeon: Greer Pickerel, MD;  Location: [facility];  Service: General;  Laterality: Bilateral;  . left knee  1986   SCOPE  . RETINAL TEAR REPAIR CRYOTHERAPY Right 2016  . right shoulder  1970   Repair of torn AC joint right shoulder  . SPHINCTEROTOMY  03/14/2011   Procedure: SPHINCTEROTOMY;  Surgeon: Landry Dyke, MD;  Location: [facility];  Service: Endoscopy;  Laterality: N/A;  . [place]    There were no vitals filed for this visit.    Subjective Assessment - 01/22/20 1659    Subjective Patient reports doctor thinks he has a sports hernia. He had hernia surgery approxametly 1 year ago, and shortly after the surgery he started having occasional sharp pains and an intense sustained tugging pain. Patient reports pain is constant, sharp pain is occasional and not associated with a particular movement or activity. Pain does not currently keep him from activity or affect sleeping. Denies any numbness or tingling, low back pain, or any other red flag signs/symptoms.    Limitations Lifting    How long can you sit comfortably? No limitation    How long can you stand comfortably? No limitation    How long can you walk comfortably? No limitation    Diagnostic tests None    Patient Stated Goals Get rid of pain    Currently in Pain? Yes    Pain Score 3     Pain Location Abdomen   inguinal region   Pain Orientation Right;Lower    Pain Descriptors / Indicators Sharp   "tugging" type pain   Pain Type Chronic pain    Pain Onset More than a month ago    Pain Frequency Constant    Aggravating Factors  Patient  reports pain is not acivity related    Pain Relieving Factors None, patient reports he pushes on the area    Effect of Pain on Daily Activities Patient denies activity limitation due to pain              [facility] PT Assessment - 01/23/20 0001      Assessment   Medical Diagnosis Right lower quadrant pain    Referring Provider (PT) Greer Pickerel, MD    Onset Date/Surgical Date 10/24/18    Next MD Visit Not scheduled    Prior Therapy None      Precautions   Precautions None      Restrictions   Weight Bearing Restrictions No      Balance Screen   Has the patient fallen in the past 6 months No    Has the patient had a decrease in activity level because of a fear of falling?  No    Is the patient reluctant to leave their home because of a  fear of falling?  No      Prior Function   Level of Independence Independent    Vocation Requirements No limitations at work      Cognition   Overall Cognitive Status Within Functional Limits for tasks assessed      Observation/Other Assessments   Observations Patient appears in no apparent distress    Focus on Therapeutic Outcomes (FOTO)  64% functional status      Coordination   Gross Motor Movements are Fluid and Coordinated Yes      Posture/Postural Control   Posture/Postural Control No significant limitations      ROM / Strength   AROM / PROM / Strength PROM;Strength      PROM   Overall PROM Comments Hip PROM grossly WFL and non-painful      Strength   Overall Strength Comments Core strength grossly 4-/5 MMT    Strength Assessment Site Hip    Right/Left Hip Right;Left    Right Hip Flexion 4+/5    Right Hip Extension 4-/5    Right Hip ABduction 4-/5    Left Hip Flexion 4+/5    Left Hip Extension 4-/5    Left Hip ABduction 4-/5      Flexibility   Soft Tissue Assessment /Muscle Length yes    Hamstrings Grossly WFL    Quadriceps Grossly WFL    Piriformis Grossly WFL      Palpation   Palpation comment TTP right inguinal region      Transfers   Transfers Independent with all Transfers                      Objective measurements completed on examination: See above findings.       [facility] Adult PT Treatment/Exercise - 01/23/20 0001      Exercises   Exercises Knee/Hip      Knee/Hip Exercises: Supine   Bridges with Diona Foley Squeeze 10 reps   3 sec hold   Straight Leg Raises 10 reps    Other Supine Knee/Hip Exercises Dead bug 2 x 10      Knee/Hip Exercises: Sidelying   Hip ABduction 10 reps                  PT Education - 01/22/20 1654    Education Details Exam findings, unclear etiology of symptoms, POC, HEP    Person(s) Educated Patient    Methods Explanation;Demonstration;Tactile cues;Verbal cues;Handout    Comprehension Verbalized  understanding;Returned demonstration;Verbal cues required;Tactile cues required;Need further instruction            PT Short Term Goals - 01/22/20 1657      PT SHORT TERM GOAL #1   Title Patient will be I with initial HEP to progress with PT    Time 3    Period Weeks    Status New    Target Date 02/12/20      PT SHORT TERM GOAL #2   Title Patient will report </= 1/10 resting pain level to reduce functional limitation    Time 3    Period Weeks    Status New    Target Date 02/12/20             PT Long Term Goals - 01/22/20 1658      PT LONG TERM GOAL #1   Title Patient will be I with final HEP to maintain progress from PT    Time 6    Period Weeks    Status New    Target Date 03/04/20      PT LONG TERM GOAL #2   Title Patient will report improved functional status to >/= 73% on FOTO    Time 6    Period Weeks    Status New    Target Date 03/04/20      PT LONG TERM GOAL #3   Title Patient will exhibit gross core and hip strength >/= 4+/5 MMT to improve control and reduce pain with activity    Time 6    Period Weeks    Status New    Target Date 03/04/20      PT LONG TERM GOAL #4   Title Patient will report 0/10 resting pain level with minimal occurrence of sharp pains to return to prior level of function    Time 6    Period Weeks    Status New    Target Date 03/04/20                  Plan - 01/22/20 1659    Clinical Impression Statement Patient presents to PT with report of right lower abdominal pain following inguinal hernia surgery. He reports constant lower level tugging pain with occasional sharp pains that are not activity or movement related. He did demonstrate gross core strength deficit with did not report any increase in concordant pain with muscle testing or valsava maneuver while attempting core activation. He did report increased pain with palpation to inguinal region but there was no palpable bulge or divot felt this visit. It is unclear  exact etiology of symptoms but with presentation of core and hip strength deficit the patient would benefit from continued skilled PT to progress his strength and control to reduce pain and return to prior level of function without limitation.    Personal Factors and Comorbidities Age;Past/Current Experience;Time since onset of injury/illness/exacerbation    Examination-Activity Limitations Lift    Examination-Participation Restrictions Community Activity;Yard Work    Stability/Clinical Decision Making Stable/Uncomplicated    Clinical Decision Making Low    Rehab Potential Good    PT Frequency 1x / week    PT Duration 6 weeks    PT Treatment/Interventions ADLs/Self Care Home Management;Cryotherapy;Electrical Stimulation;Iontophoresis 4mg /ml Dexamethasone;Moist Heat;Ultrasound;Neuromuscular re-education;Balance training;Therapeutic exercise;Therapeutic activities;Functional mobility training;Stair training;Gait training;Patient/family education;Manual techniques;Dry needling;Passive range of motion;Taping;Joint Manipulations    PT Next Visit Plan Review HEP and progress PRN, manual and modalities as indicated, progress core (abdominal) and hip strengthening as tolerated  PT Home Exercise Plan [email or web address]: SLR with abdominal brace, bridge with ball squeeze, dead bug, sidelying hip abduction    Consulted and Agree with Plan of Care Patient           Patient will benefit from skilled therapeutic intervention in order to improve the following deficits and impairments:  Decreased strength, Pain, Decreased activity tolerance  Visit Diagnosis: Right lower quadrant abdominal pain  Muscle weakness (generalized)     Problem List Patient Active Problem List   Diagnosis Date Noted  . Peripheral neuropathy 07/03/2017    Hilda Blades, PT, DPT, LAT, ATC 01/23/20  9:07 AM Phone: [phone] Fax: [place] [facility] [address] [place], [place], [address] Phone: [phone]   Fax:  [phone]  Name: Jeffrey Li MRN: [id number] Date of Birth: [birth year]

## 2020-02-14 ENCOUNTER — Ambulatory Visit: Payer: BC Managed Care – PPO | Attending: General Surgery | Admitting: Physical Therapy

## 2020-02-14 ENCOUNTER — Encounter: Payer: Self-pay | Admitting: Physical Therapy

## 2020-02-14 ENCOUNTER — Other Ambulatory Visit: Payer: Self-pay

## 2020-02-14 DIAGNOSIS — M6281 Muscle weakness (generalized): Secondary | ICD-10-CM | POA: Diagnosis present

## 2020-02-14 DIAGNOSIS — R1031 Right lower quadrant pain: Secondary | ICD-10-CM | POA: Diagnosis not present

## 2020-02-14 NOTE — Therapy (Signed)
[place], [place], [address] Phone: [phone]   Fax:  [phone]  Physical Therapy Treatment  Patient Details  Name: Jeffrey Li MRN: [id number] Date of Birth: 03/22/1951 Referring Provider (PT): Greer Pickerel, MD   Encounter Date: 02/14/2020   PT End of Session - 02/14/20 0819    Visit Number 2    Number of Visits 6    Date for PT Re-Evaluation 03/04/20    Authorization Type BCBS    PT Start Time 0815    PT Stop Time 0856    PT Time Calculation (min) 41 min    Activity Tolerance Patient tolerated treatment well    Behavior During Therapy [facility] for tasks assessed/performed           Past Medical History:  Diagnosis Date  . 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 Chronic low back pain   . Complication of anesthesia 1970   "TOO MUCH ANESTHETIC AFTER TRASHING AROUND HAD ANESTHESIA OVERDOSE"  . Erectile dysfunction   . GERD (gastroesophageal reflux disease)   . Gout   . Migraine headache    NONE IN 20 YEARS  . Peripheral neuropathy 07/03/2017   TOES AND FEET  . Sleep apnea    NONE WITH LATEST TEST 20-19    Past Surgical History:  Procedure Laterality Date  . ERCP  03/14/2011   Procedure: ENDOSCOPIC RETROGRADE CHOLANGIOPANCREATOGRAPHY (ERCP);  Surgeon: Landry Dyke, MD;  Location: [facility];  Service: Endoscopy;  Laterality: N/A;  . INGUINAL HERNIA REPAIR Bilateral 10/24/2018   Procedure: LAPAROSCOPIC BILATERAL INGUINAL HERNIAS REPAIR WITH MESH;  Surgeon: Greer Pickerel, MD;  Location: [facility];  Service: General;  Laterality: Bilateral;  . left knee  1986   SCOPE  . RETINAL TEAR REPAIR CRYOTHERAPY Right 2016  . right shoulder  1970   Repair of torn AC joint right shoulder  . SPHINCTEROTOMY  03/14/2011   Procedure: SPHINCTEROTOMY;  Surgeon: Landry Dyke, MD;  Location: [facility];  Service: Endoscopy;  Laterality: N/A;  . [place]    There were  no vitals filed for this visit.   Subjective Assessment - 02/14/20 0818    Subjective Pain from scrotum to ASIS. Typically late afternoon and AM pain is worse.    Patient Stated Goals Get rid of pain                             [facility] Adult PT Treatment/Exercise - 02/14/20 0001      Pilates   Pilates Reformer foot work 2R 1B, attempted feet in straps 1 red      Knee/Hip Exercises: Stretches   Other Knee/Hip Stretches Hesh self correction for Rt post innom rotation 2 min hold      Knee/Hip Exercises: Supine   Other Supine Knee/Hip Exercises ab set with iso adduction    Other Supine Knee/Hip Exercises supine 90/90 abdominal bracing      Knee/Hip Exercises: Prone   Other Prone Exercises plank on knees                    PT Short Term Goals - 01/22/20 1657      PT SHORT TERM GOAL #1   Title Patient will be I with initial HEP to progress with PT    Time 3    Period Weeks    Status New  Target Date 02/12/20      PT SHORT TERM GOAL #2   Title Patient will report </= 1/10 resting pain level to reduce functional limitation    Time 3    Period Weeks    Status New    Target Date 02/12/20             PT Long Term Goals - 01/22/20 1658      PT LONG TERM GOAL #1   Title Patient will be I with final HEP to maintain progress from PT    Time 6    Period Weeks    Status New    Target Date 03/04/20      PT LONG TERM GOAL #2   Title Patient will report improved functional status to >/= 73% on FOTO    Time 6    Period Weeks    Status New    Target Date 03/04/20      PT LONG TERM GOAL #3   Title Patient will exhibit gross core and hip strength >/= 4+/5 MMT to improve control and reduce pain with activity    Time 6    Period Weeks    Status New    Target Date 03/04/20      PT LONG TERM GOAL #4   Title Patient will report 0/10 resting pain level with minimal occurrence of sharp pains to return to prior level of function    Time 6    Period  Weeks    Status New    Target Date 03/04/20                 Plan - 02/14/20 0856    Clinical Impression Statement Notable pelvic rotation corrected with Hesh approach and focused exercises on lumbopelvic stability with bilateral engagement equally. Pt has difficulty with coordinated control of abdominal musculature with dominance in rib cage flare and hip flexors. Attempted reformer but moved back to mat due to difficulty controlling core.    PT Next Visit Plan cont core control    PT Home Exercise Plan [email or web address]: , bridge with ball squeeze, sidelying hip abduction, 90/90 abd brace, plank knee/elbow    Consulted and Agree with Plan of Care Patient           Patient will benefit from skilled therapeutic intervention in order to improve the following deficits and impairments:  Decreased strength, Pain, Decreased activity tolerance  Visit Diagnosis: Right lower quadrant abdominal pain  Muscle weakness (generalized)     Problem List Patient Active Problem List   Diagnosis Date Noted  . Peripheral neuropathy 07/03/2017   Essie Lagunes C. Khristian Phillippi PT, DPT 02/14/20 8:59 AM   [place] [facility] [address] [place], [place], [address] Phone: [phone]   Fax:  [phone]  Name: Jeffrey Li MRN: [id number] Date of Birth: 03-25-1951

## 2020-02-21 ENCOUNTER — Other Ambulatory Visit: Payer: Self-pay

## 2020-02-21 ENCOUNTER — Ambulatory Visit: Payer: BC Managed Care – PPO | Admitting: Physical Therapy

## 2020-02-21 ENCOUNTER — Encounter: Payer: Self-pay | Admitting: Physical Therapy

## 2020-02-21 DIAGNOSIS — M6281 Muscle weakness (generalized): Secondary | ICD-10-CM

## 2020-02-21 DIAGNOSIS — R1031 Right lower quadrant pain: Secondary | ICD-10-CM | POA: Diagnosis not present

## 2020-02-21 NOTE — Therapy (Addendum)
[place], [place], [address] Phone: [phone]   Fax:  [phone]  Physical Therapy Treatment  Patient Details  Name: Jeffrey Li MRN: [id number] Date of Birth: December 13, 1950 Referring Provider (PT): Greer Pickerel, MD   Encounter Date: 02/21/2020   PT End of Session - 02/21/20 0948    Visit Number 3    Number of Visits 6    Date for PT Re-Evaluation 03/04/20    Authorization Type BCBS    Authorization Time Period FOTO by 6th visit    PT Start Time 0947    PT Stop Time 1030    PT Time Calculation (min) 43 min    Activity Tolerance Patient tolerated treatment well    Behavior During Therapy [facility] for tasks assessed/performed           Past Medical History:  Diagnosis Date  . 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 Chronic low back pain   . Complication of anesthesia 1970   "TOO MUCH ANESTHETIC AFTER TRASHING AROUND HAD ANESTHESIA OVERDOSE"  . Erectile dysfunction   . GERD (gastroesophageal reflux disease)   . Gout   . Migraine headache    NONE IN 20 YEARS  . Peripheral neuropathy 07/03/2017   TOES AND FEET  . Sleep apnea    NONE WITH LATEST TEST 20-19    Past Surgical History:  Procedure Laterality Date  . ERCP  03/14/2011   Procedure: ENDOSCOPIC RETROGRADE CHOLANGIOPANCREATOGRAPHY (ERCP);  Surgeon: Landry Dyke, MD;  Location: [facility];  Service: Endoscopy;  Laterality: N/A;  . INGUINAL HERNIA REPAIR Bilateral 10/24/2018   Procedure: LAPAROSCOPIC BILATERAL INGUINAL HERNIAS REPAIR WITH MESH;  Surgeon: Greer Pickerel, MD;  Location: [facility];  Service: General;  Laterality: Bilateral;  . left knee  1986   SCOPE  . RETINAL TEAR REPAIR CRYOTHERAPY Right 2016  . right shoulder  1970   Repair of torn AC joint right shoulder  . SPHINCTEROTOMY  03/14/2011   Procedure: SPHINCTEROTOMY;  Surgeon: Landry Dyke, MD;  Location: [facility];  Service: Endoscopy;  Laterality: N/A;  . [place]    There were no vitals filed for this visit.   Subjective Assessment - 02/21/20 0946    Subjective "After my previous visit I feel it more in my hip and back in the middle of the night; but overall feeling okay today"    Patient Stated Goals Get rid of pain    Currently in Pain? No/denies                             [facility] Adult PT Treatment/Exercise - 02/21/20 0001      Knee/Hip Exercises: Seated   Other Seated Knee/Hip Exercises green swiss ball paloff press 3x20 green theraband with vc for core control      Knee/Hip Exercises: Supine   Other Supine Knee/Hip Exercises ab set with isometric hold and PPT 3x5s hold    Other Supine Knee/Hip Exercises Green Swiss ball passthroughs were attempted and too difficult; Dead bugs 2x10 bilaterally;      Knee/Hip Exercises: Prone   Other Prone Exercises Bird dogs 3x8 bilaterally   VC pelvis level, slide toes posterior and lift     Shoulder Exercises: Isometric Strengthening   Other Isometric Exercises Tall planks 2x30s, side planks bilaterally 2x30s,  PT Education - 02/21/20 1000    Education Details Transverse abdominal contraction, posterior pelvic tilt, checking form during sets and adjusting as needed    Person(s) Educated Patient    Methods Explanation    Comprehension Verbalized understanding            PT Short Term Goals - 02/21/20 1152      PT SHORT TERM GOAL #1   Title Patient will be I with initial HEP to progress with PT    Time 3    Period Weeks    Status Achieved    Target Date 02/21/20      PT SHORT TERM GOAL #2   Title Patient will report </= 1/10 resting pain level to reduce functional limitation    Time 3    Period Weeks    Status Achieved    Target Date 02/21/20             PT Long Term Goals - 01/22/20 1658      PT LONG TERM GOAL #1   Title Patient will be I with final HEP to maintain progress from PT    Time 6    Period  Weeks    Status New    Target Date 03/04/20      PT LONG TERM GOAL #2   Title Patient will report improved functional status to >/= 73% on FOTO    Time 6    Period Weeks    Status New    Target Date 03/04/20      PT LONG TERM GOAL #3   Title Patient will exhibit gross core and hip strength >/= 4+/5 MMT to improve control and reduce pain with activity    Time 6    Period Weeks    Status New    Target Date 03/04/20      PT LONG TERM GOAL #4   Title Patient will report 0/10 resting pain level with minimal occurrence of sharp pains to return to prior level of function    Time 6    Period Weeks    Status New    Target Date 03/04/20                 Plan - 02/21/20 1001    Clinical Impression Statement Pt continues to have noted difficulty with abdominal control and posterior pelvic tilt. Focus of therapy today was addressing these issues with supine and quadruped core workouts that were well tolerated. Pt required moderate verbal and tactile cuing to complete exercises with proper form.    Personal Factors and Comorbidities Age;Past/Current Experience;Time since onset of injury/illness/exacerbation    Examination-Activity Limitations Lift    Examination-Participation Restrictions Community Activity;[employer] Work    PT Treatment/Interventions ADLs/Self Care Home Management;Cryotherapy;Electrical Stimulation;Iontophoresis 4mg /ml Dexamethasone;Moist Heat;Ultrasound;Neuromuscular re-education;Balance training;Therapeutic exercise;Therapeutic activities;Functional mobility training;Stair training;Gait training;Patient/family education;Manual techniques;Dry needling;Passive range of motion;Taping;Joint Manipulations    PT Next Visit Plan cont core control    PT Home Exercise Plan [email or web address]: , bridge with ball squeeze, sidelying hip abduction, 90/90 abd brace, tall planks, side planks, dead bugs, bird dogs    Consulted and Agree with Plan of Care Patient           Patient will benefit  from skilled therapeutic intervention in order to improve the following deficits and impairments:  Decreased strength, Pain, Decreased activity tolerance  Visit Diagnosis: Right lower quadrant abdominal pain  Muscle weakness (generalized)     Problem List Patient Active Problem List   Diagnosis  Date Noted  . Peripheral neuropathy 07/03/2017    Dawayne Cirri, SPT 02/21/2020, 11:53 AM  [facility] [address] [place], [place], [address] Phone: [phone]   Fax:  [phone]  Name: Nahuel Wilbert MRN: [id number] Date of Birth: 10-27-1950

## 2020-02-21 NOTE — Patient Instructions (Signed)
Access Code: [email or web address] URL: [email or web address] Date: 02/21/2020 Prepared by: [place].  Exercises Supine Bridge with Mini Swiss Ball Between Knees - 1 x daily - 3-4 x weekly - 2-3 sets - 10 reps - 3-5 seconds hold Sidelying Hip Abduction - 1 x daily - 3-4 x weekly - 2-3 sets - 10 reps Supine 90/90 Abdominal Bracing - 1 x daily - 7 x weekly - 5 sets - 10s hold Bird Dog - 1 x daily - 4 x weekly - 3 sets - 10 reps Supine Dead Bug with Leg Extension - 1 x daily - 4 x weekly - 3 sets - 10 reps Full Plank - 1 x daily - 7 x weekly - 5 reps - 30 hold Side Plank on Elbow - 1 x daily - 4 x weekly - 5 sets - 30 reps

## 2020-03-04 ENCOUNTER — Other Ambulatory Visit: Payer: Self-pay

## 2020-03-04 ENCOUNTER — Encounter: Payer: Self-pay | Admitting: Physical Therapy

## 2020-03-04 ENCOUNTER — Ambulatory Visit: Payer: BC Managed Care – PPO | Attending: General Surgery | Admitting: Physical Therapy

## 2020-03-04 DIAGNOSIS — M6281 Muscle weakness (generalized): Secondary | ICD-10-CM

## 2020-03-04 DIAGNOSIS — R1031 Right lower quadrant pain: Secondary | ICD-10-CM | POA: Diagnosis not present

## 2020-03-04 NOTE — Therapy (Signed)
[place], [place], [address] Phone: [phone]   Fax:  [phone]  Physical Therapy Treatment / ERO  Patient Details  Name: Jeffrey Li MRN: [id number] Date of Birth: 23-Jun-1950 Referring Provider (PT): Greer Pickerel, MD   Encounter Date: 03/04/2020   PT End of Session - 03/04/20 1615    Visit Number 4    Number of Visits 5    Date for PT Re-Evaluation 03/11/20    Authorization Type BCBS    Authorization Time Period FOTO by 6th visit    PT Start Time 1607    PT Stop Time 1650    PT Time Calculation (min) 43 min    Activity Tolerance Patient tolerated treatment well    Behavior During Therapy [facility] for tasks assessed/performed           Past Medical History:  Diagnosis Date  . 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 Chronic low back pain   . Complication of anesthesia 1970   "TOO MUCH ANESTHETIC AFTER TRASHING AROUND HAD ANESTHESIA OVERDOSE"  . Erectile dysfunction   . GERD (gastroesophageal reflux disease)   . Gout   . Migraine headache    NONE IN 20 YEARS  . Peripheral neuropathy 07/03/2017   TOES AND FEET  . Sleep apnea    NONE WITH LATEST TEST 20-19    Past Surgical History:  Procedure Laterality Date  . ERCP  03/14/2011   Procedure: ENDOSCOPIC RETROGRADE CHOLANGIOPANCREATOGRAPHY (ERCP);  Surgeon: Landry Dyke, MD;  Location: [facility];  Service: Endoscopy;  Laterality: N/A;  . INGUINAL HERNIA REPAIR Bilateral 10/24/2018   Procedure: LAPAROSCOPIC BILATERAL INGUINAL HERNIAS REPAIR WITH MESH;  Surgeon: Greer Pickerel, MD;  Location: [facility];  Service: General;  Laterality: Bilateral;  . left knee  1986   SCOPE  . RETINAL TEAR REPAIR CRYOTHERAPY Right 2016  . right shoulder  1970   Repair of torn AC joint right shoulder  . SPHINCTEROTOMY  03/14/2011   Procedure: SPHINCTEROTOMY;  Surgeon: Landry Dyke, MD;  Location: [facility];  Service: Endoscopy;  Laterality: N/A;  . [place]    There were no vitals filed for this visit.   Subjective Assessment - 03/04/20 1611    Subjective Patient reports the hip/groin pain has not changed since start of therapy. His exercises are still challenging for him, especially for the side planks, and they do not cause him any discomfort.    Patient Stated Goals Get rid of pain    Currently in Pain? No/denies              [facility] PT Assessment - 03/04/20 0001      Assessment   Medical Diagnosis Right lower quadrant pain    Referring Provider (PT) Greer Pickerel, MD    Onset Date/Surgical Date 10/24/18      Precautions   Precautions None      Restrictions   Weight Bearing Restrictions No      Balance Screen   Has the patient fallen in the past 6 months No      Prior Function   Level of Independence Independent      Observation/Other Assessments   Focus on Therapeutic Outcomes (FOTO)  Not assessed this visit      Strength   Overall Strength Comments Core strength grossly 4/5 MMT    Right Hip Extension 4/5    Right Hip ABduction 4/5  Left Hip Extension 4/5    Left Hip ABduction 4/5                         [facility] Adult PT Treatment/Exercise - 03/04/20 0001      Exercises   Exercises Knee/Hip      Knee/Hip Exercises: Standing   Other Standing Knee Exercises Pallof press with FM 13# 2 x 10    Other Standing Knee Exercises Diagonal chop with FM 10# 2 x10      Knee/Hip Exercises: Supine   Bridges 10 reps   3 sec hold   Bridges Limitations 2 x 10 marching bridges    Other Supine Knee/Hip Exercises 90-90 alternating leg extension uisng dowel 2 x 10      Knee/Hip Exercises: Sidelying   Other Sidelying Knee/Hip Exercises Side plank 3 x 30 sec      Knee/Hip Exercises: Prone   Other Prone Exercises Bird dogs x 10     Other Prone Exercises Bear crawl alternating leg extensions 2 x 10                  PT Education - 03/04/20 1615    Education Details HEP     Person(s) Educated Patient    Methods Explanation    Comprehension Verbalized understanding;Need further instruction            PT Short Term Goals - 02/21/20 1152      PT SHORT TERM GOAL #1   Title Patient will be I with initial HEP to progress with PT    Time 3    Period Weeks    Status Achieved    Target Date 02/21/20      PT SHORT TERM GOAL #2   Title Patient will report </= 1/10 resting pain level to reduce functional limitation    Time 3    Period Weeks    Status Achieved    Target Date 02/21/20             PT Long Term Goals - 03/04/20 1656      PT LONG TERM GOAL #1   Title Patient will be I with final HEP to maintain progress from PT    Time 1    Period Weeks    Status On-going    Target Date 03/11/20      PT LONG TERM GOAL #2   Title Patient will report improved functional status to >/= 73% on FOTO    Time 1    Period Weeks    Status On-going    Target Date 03/11/20      PT LONG TERM GOAL #3   Title Patient will exhibit gross core and hip strength >/= 4+/5 MMT to improve control and reduce pain with activity    Time 1    Period Weeks    Status On-going    Target Date 03/11/20      PT LONG TERM GOAL #4   Title Patient will report 0/10 resting pain level with minimal occurrence of sharp pains to return to prior level of function    Time 1    Period Weeks    Status On-going    Target Date 03/11/20                 Plan - 03/04/20 1645    Clinical Impression Statement Patient tolerated therapy well with no adverse effects. Continued to focus on core strengthening this visit with  good tolerance. He demonstrates improvement in his strength but does continue to exhibit deficits with lumbopelvic control. Patient did not report any pain with exercises but continues to note no change in overall symptoms. He would benefit from continued skilled PT to progress core strength in order to reduce groin/hip pain.    PT Frequency 1x / week    PT  Duration Other (comment)   1 week   PT Treatment/Interventions ADLs/Self Care Home Management;Cryotherapy;Electrical Stimulation;Iontophoresis 4mg /ml Dexamethasone;Moist Heat;Ultrasound;Neuromuscular re-education;Balance training;Therapeutic exercise;Therapeutic activities;Functional mobility training;Stair training;Gait training;Patient/family education;Manual techniques;Dry needling;Passive range of motion;Taping;Joint Manipulations    PT Next Visit Plan cont core control    PT Home Exercise Plan [email or web address]: bridge with ball squeeze, sidelying hip abduction, 90/90 abd brace, tall planks, side planks, dead bugs, bird dogs    Consulted and Agree with Plan of Care Patient           Patient will benefit from skilled therapeutic intervention in order to improve the following deficits and impairments:  Decreased strength, Pain, Decreased activity tolerance  Visit Diagnosis: Right lower quadrant abdominal pain  Muscle weakness (generalized)     Problem List Patient Active Problem List   Diagnosis Date Noted  . Peripheral neuropathy 07/03/2017    Hilda Blades, PT, DPT, LAT, ATC 03/04/20  4:58 PM Phone: [phone] Fax: [facility] [address] [place], [place], [address] Phone: [phone]   Fax:  [phone]  Name: Jeffrey Li MRN: [id number] Date of Birth: 04-13-1950

## 2020-03-11 ENCOUNTER — Encounter: Payer: Self-pay | Admitting: Physical Therapy

## 2020-03-11 ENCOUNTER — Other Ambulatory Visit: Payer: Self-pay

## 2020-03-11 ENCOUNTER — Ambulatory Visit: Payer: BC Managed Care – PPO | Admitting: Physical Therapy

## 2020-03-11 ENCOUNTER — Encounter (INDEPENDENT_AMBULATORY_CARE_PROVIDER_SITE_OTHER): Payer: Self-pay | Admitting: Ophthalmology

## 2020-03-11 ENCOUNTER — Ambulatory Visit (INDEPENDENT_AMBULATORY_CARE_PROVIDER_SITE_OTHER): Payer: BC Managed Care – PPO | Admitting: Ophthalmology

## 2020-03-11 DIAGNOSIS — H43811 Vitreous degeneration, right eye: Secondary | ICD-10-CM | POA: Diagnosis not present

## 2020-03-11 DIAGNOSIS — R1031 Right lower quadrant pain: Secondary | ICD-10-CM | POA: Diagnosis not present

## 2020-03-11 DIAGNOSIS — H33301 Unspecified retinal break, right eye: Secondary | ICD-10-CM | POA: Diagnosis not present

## 2020-03-11 DIAGNOSIS — H2512 Age-related nuclear cataract, left eye: Secondary | ICD-10-CM | POA: Diagnosis not present

## 2020-03-11 DIAGNOSIS — H2511 Age-related nuclear cataract, right eye: Secondary | ICD-10-CM | POA: Insufficient documentation

## 2020-03-11 DIAGNOSIS — M6281 Muscle weakness (generalized): Secondary | ICD-10-CM

## 2020-03-11 DIAGNOSIS — H43812 Vitreous degeneration, left eye: Secondary | ICD-10-CM

## 2020-03-11 NOTE — Assessment & Plan Note (Signed)
Good laser retinopexy around retinal break temporally, will continue to observe

## 2020-03-11 NOTE — Therapy (Signed)
[address], [place], [address] Phone: [phone]   Fax:  [phone]  Physical Therapy Treatment / Discharge  Patient Details  Name: Jeffrey Li MRN: [id number] Date of Birth: 23-Jul-1950 Referring Provider (PT): Greer Pickerel, MD   Encounter Date: 03/11/2020   PT End of Session - 03/11/20 1656    Visit Number 5    Number of Visits 5    Date for PT Re-Evaluation 03/11/20    Authorization Type BCBS    Authorization Time Period FOTO by 6th visit    PT Start Time 1700    PT Stop Time 1740    PT Time Calculation (min) 40 min    Activity Tolerance Patient tolerated treatment well    Behavior During Therapy [facility] for tasks assessed/performed           Past Medical History:  Diagnosis Date  . 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 Chronic low back pain   . Complication of anesthesia 1970   "TOO MUCH ANESTHETIC AFTER TRASHING AROUND HAD ANESTHESIA OVERDOSE"  . Erectile dysfunction   . GERD (gastroesophageal reflux disease)   . Gout   . Migraine headache    NONE IN 20 YEARS  . Peripheral neuropathy 07/03/2017   TOES AND FEET  . Sleep apnea    NONE WITH LATEST TEST 20-19    Past Surgical History:  Procedure Laterality Date  . ERCP  03/14/2011   Procedure: ENDOSCOPIC RETROGRADE CHOLANGIOPANCREATOGRAPHY (ERCP);  Surgeon: Landry Dyke, MD;  Location: [facility];  Service: Endoscopy;  Laterality: N/A;  . INGUINAL HERNIA REPAIR Bilateral 10/24/2018   Procedure: LAPAROSCOPIC BILATERAL INGUINAL HERNIAS REPAIR WITH MESH;  Surgeon: Greer Pickerel, MD;  Location: [facility];  Service: General;  Laterality: Bilateral;  . left knee  1986   SCOPE  . RETINAL TEAR REPAIR CRYOTHERAPY Right 2016  . right shoulder  1970   Repair of torn AC joint right shoulder  . SPHINCTEROTOMY  03/14/2011   Procedure: SPHINCTEROTOMY;  Surgeon: Landry Dyke, MD;  Location: [facility];  Service: Endoscopy;  Laterality: N/A;  .  [place]    There were no vitals filed for this visit.   Subjective Assessment - 03/11/20 1657    Subjective Patient reports persistent hip/groin pain with no change. He is planning to follow-up with the referring provider.    How long can you sit comfortably? No limitation    How long can you stand comfortably? No limitation    How long can you walk comfortably? No limitation    Patient Stated Goals Get rid of pain    Currently in Pain? No/denies    Pain Score --   3/10 with activity   Pain Location Abdomen   inguinal region   Pain Orientation Right;Lower    Pain Descriptors / Indicators Sharp   tugging   Pain Type Chronic pain    Pain Onset More than a month ago    Pain Frequency Constant              [facility] PT Assessment - 03/11/20 0001      Assessment   Medical Diagnosis Right lower quadrant pain    Referring Provider (PT) Greer Pickerel, MD    Onset Date/Surgical Date 10/24/18      Precautions   Precautions None      Restrictions   Weight Bearing Restrictions No  Balance Screen   Has the patient fallen in the past 6 months No      Observation/Other Assessments   Focus on Therapeutic Outcomes (FOTO)  59% functional status      Strength   Overall Strength Comments Core strength grossly 4+/5 MMT    Right Hip Flexion 4+/5    Right Hip Extension 4+/5    Right Hip ABduction 4/5    Left Hip Flexion 4+/5    Left Hip Extension 4+/5    Left Hip ABduction 4/5      Palpation   Palpation comment TTP right inguinal region      Transfers   Transfers Independent with all Transfers                         [facility] Adult PT Treatment/Exercise - 03/11/20 0001      Self-Care   Self-Care Other Self-Care Comments    Other Self-Care Comments  POC discharge, follow-up with referring provided, HEP      Exercises   Exercises Knee/Hip      Knee/Hip Exercises: Aerobic   Recumbent Bike L3 x 5 min      Knee/Hip Exercises:  Standing   Functional Squat 10 reps    Functional Squat Limitations tap to table    Other Standing Knee Exercises Split stationary lunge x 10 each      Knee/Hip Exercises: Supine   Bridges with Ball Squeeze 10 reps    Other Supine Knee/Hip Exercises 90-90 hold x 20 sec    Other Supine Knee/Hip Exercises Dead bug x 10      Knee/Hip Exercises: Sidelying   Hip ABduction 10 reps    Other Sidelying Knee/Hip Exercises Side plank x 30 sec each      Knee/Hip Exercises: Prone   Other Prone Exercises Bird dogs x 10     Other Prone Exercises Tall plank x 30 sec                  PT Education - 03/11/20 1656    Education Details POC discharge, HEP, follow-up with referring provider    Person(s) Educated Patient    Methods Explanation    Comprehension Verbalized understanding            PT Short Term Goals - 02/21/20 1152      PT SHORT TERM GOAL #1   Title Patient will be I with initial HEP to progress with PT    Time 3    Period Weeks    Status Achieved    Target Date 02/21/20      PT SHORT TERM GOAL #2   Title Patient will report </= 1/10 resting pain level to reduce functional limitation    Time 3    Period Weeks    Status Achieved    Target Date 02/21/20             PT Long Term Goals - 03/11/20 1745      PT LONG TERM GOAL #1   Title Patient will be I with final HEP to maintain progress from PT    Time 1    Period Weeks    Status Achieved      PT LONG TERM GOAL #2   Title Patient will report improved functional status to >/= 73% on FOTO    Baseline Patient reports worsening FOTO status    Time 1    Period Weeks    Status Not Met  PT LONG TERM GOAL #3   Title Patient will exhibit gross core and hip strength >/= 4+/5 MMT to improve control and reduce pain with activity    Baseline Patient exhibits much improved hip/core strength but continued deficits    Time 1    Period Weeks    Status Partially Met      PT LONG TERM GOAL #4   Title Patient  will report 0/10 resting pain level with minimal occurrence of sharp pains to return to prior level of function    Baseline Patient reports 3/10 tugging/sharp pain with activity    Time 1    Period Weeks    Status Partially Met                 Plan - 03/11/20 1656    Clinical Impression Statement Patient tolerated therapy well with no adverse effects. Patient continues to report no improvement in abdominal symptoms with therapy even with improvements in core/hip strength, and he notes worsening on FOTO score. Patient elects to follow-up with referring provider and discharge from PT. Reviewed HEP and patient demonstrates independence with proper technique. Patient will be discharged and he was instructed on follow-up methods if needed.    PT Treatment/Interventions ADLs/Self Care Home Management;Cryotherapy;Electrical Stimulation;Iontophoresis 62m/ml Dexamethasone;Moist Heat;Ultrasound;Neuromuscular re-education;Balance training;Therapeutic exercise;Therapeutic activities;Functional mobility training;Stair training;Gait training;Patient/family education;Manual techniques;Dry needling;Passive range of motion;Taping;Joint Manipulations    PT Next Visit Plan NA - discharge    PT Home Exercise Plan [email or web address]: bridge with ball squeeze, sidelying hip abduction, 90/90 abd brace, tall planks, side planks, dead bugs, bird dogs    Consulted and Agree with Plan of Care Patient           Patient will benefit from skilled therapeutic intervention in order to improve the following deficits and impairments:  Decreased strength,Pain,Decreased activity tolerance  Visit Diagnosis: Right lower quadrant abdominal pain  Muscle weakness (generalized)     Problem List Patient Active Problem List   Diagnosis Date Noted  . Nuclear sclerotic cataract of right eye 03/11/2020  . Nuclear sclerotic cataract of left eye 03/11/2020  . Retinal break of right eye 03/11/2020  . Posterior vitreous detachment of  right eye 03/11/2020  . Posterior vitreous detachment of left eye 03/11/2020  . Peripheral neuropathy 07/03/2017    CHilda Blades PT, DPT, LAT, ATC 03/11/20  5:51 PM Phone: [phone]: [facility] [address]Phone: [phone]  Fax:  [phone] Name: MDiogo AnneMRN: [id number] of Birth: 516-Aug-1952  PHYSICAL THERAPY DISCHARGE SUMMARY  Visits from Start of Care: 5  Current functional level related to goals / functional outcomes: See above   Remaining deficits: See above   Education / Equipment: HEP Plan: Patient agrees to discharge.  Patient goals were partially met. Patient is being discharged due to lack of progress.  ?????

## 2020-03-11 NOTE — Assessment & Plan Note (Signed)
Sunglasses analogy discussed as to describe the for symptoms that he may have in the future  Clear overall now with good acuity

## 2020-03-11 NOTE — Patient Instructions (Signed)
Access Code: [email or web address] URL: [email or web address] Date: 03/11/2020 Prepared by: Hilda Blades  Exercises Supine Bridge with [employer] Between Knees - 1 x daily - 3-4 x weekly - 2-3 sets - 10 reps - 3-5 seconds hold Sidelying Hip Abduction - 1 x daily - 3-4 x weekly - 2-3 sets - 10 reps Supine 90/90 Abdominal Bracing - 1 x daily - 3-4 x weekly - 5 sets - 10s hold Bird Dog - 1 x daily - 3-4 x weekly - 3 sets - 10 reps Supine Dead Bug with Leg Extension - 1 x daily - 3-4 x weekly - 3 sets - 10 reps Full Plank - 1 x daily - 3-4 x weekly - 5 reps - 30 hold Side Plank on Elbow - 1 x daily - 3-4 x weekly - 5 sets - 30 reps

## 2020-03-11 NOTE — Progress Notes (Signed)
03/11/2020     CHIEF COMPLAINT Patient presents for Retina Follow Up   HISTORY OF PRESENT ILLNESS: Jeffrey Li is a 69 y.o. male who presents to the clinic today for:   HPI    Retina Follow Up    Patient presents with  PVD.  In right eye.  Severity is moderate.  Duration of 2 years.  Since onset it is stable.  I, the attending physician,  performed the HPI with the patient and updated documentation appropriately.          Comments    2 Year f\u OU. OCT  Pt states vision has been slightly decreasing each year. Pt sees occasional floaters. Denies FOL.       Last edited by Tilda Franco on 03/11/2020  8:36 AM. (History)      Referring physician: [facility] [address] [place],  [place] [address]  HISTORICAL INFORMATION:   Selected notes from the MEDICAL RECORD NUMBER       CURRENT MEDICATIONS: No current outpatient medications on file. (Ophthalmic Drugs)   No current facility-administered medications for this visit. (Ophthalmic Drugs)   Current Outpatient Medications (Other)  Medication Sig  . acetaminophen (TYLENOL) 500 MG tablet Take 500 mg by mouth every 6 (six) hours as needed.  Marland Kitchen allopurinol (ZYLOPRIM) 300 MG tablet Take 150 mg by mouth at bedtime.   . Alpha-Lipoic Acid 300 MG CAPS Take 3 capsules by mouth daily.   . B COMPLEX-BIOTIN-FA PO Take 1 capsule by mouth daily.   . Coenzyme Q10 (CO Q 10 PO) Take 600 mg by mouth daily.   . fluocinonide ointment (LIDEX) 7.01 % Apply 1 application topically every 14 (fourteen) days.   Marland Kitchen ibuprofen (ADVIL,MOTRIN) 200 MG tablet Take 200 mg by mouth 2 (two) times daily.   Marland Kitchen loratadine (CLARITIN) 10 MG tablet Take 10 mg by mouth daily as needed.   Marland Kitchen MAGNESIUM-CALCIUM-FOLIC ACID PO Take 779 mg by mouth daily.  (Patient not taking: Reported on 01/22/2020)  . Multiple Vitamins-Minerals (MULTIVITAMINS THER. W/MINERALS) TABS Take 1 tablet by mouth daily.    . Omega-3 Fatty Acids (OMEGA-3  FISH OIL PO) Take 100 mg by mouth 3 (three) times daily.   Marland Kitchen oxyCODONE (OXY IR/ROXICODONE) 5 MG immediate release tablet Take 1 tablet (5 mg total) by mouth every 6 (six) hours as needed for severe pain. (Patient not taking: Reported on 01/22/2020)  . sildenafil (REVATIO) 20 MG tablet Take 20 mg by mouth as needed. Take 2-5 tablets when needed  . VITAMIN E PO Take 2 capsules by mouth daily. 450mg - 2 capsules daily   No current facility-administered medications for this visit. (Other)   Facility-Administered Medications Ordered in Other Visits (Other)  Medication Route  . gadopentetate dimeglumine (MAGNEVIST) injection 20 mL Intravenous      REVIEW OF SYSTEMS:    ALLERGIES Allergies  Allergen Reactions  . Other     Melons including cucumbers-stomach  upset  . Shellfish Allergy Nausea And Vomiting    mollusk    PAST MEDICAL HISTORY Past Medical History:  Diagnosis Date  . 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 Chronic low back pain   . Complication of anesthesia 1970   "TOO MUCH ANESTHETIC AFTER TRASHING AROUND HAD ANESTHESIA OVERDOSE"  . Erectile dysfunction   . GERD (gastroesophageal reflux disease)   . Gout   . Migraine headache    NONE IN 20 YEARS  .  Peripheral neuropathy 07/03/2017   TOES AND FEET  . Sleep apnea    NONE WITH LATEST TEST 20-19   Past Surgical History:  Procedure Laterality Date  . ERCP  03/14/2011   Procedure: ENDOSCOPIC RETROGRADE CHOLANGIOPANCREATOGRAPHY (ERCP);  Surgeon: Landry Dyke, MD;  Location: [facility];  Service: Endoscopy;  Laterality: N/A;  . INGUINAL HERNIA REPAIR Bilateral 10/24/2018   Procedure: LAPAROSCOPIC BILATERAL INGUINAL HERNIAS REPAIR WITH MESH;  Surgeon: Greer Pickerel, MD;  Location: [facility];  Service: General;  Laterality: Bilateral;  . left knee  1986   SCOPE  . RETINAL TEAR REPAIR CRYOTHERAPY Right 2016  . right shoulder  1970   Repair of torn AC joint right shoulder  . SPHINCTEROTOMY  03/14/2011   Procedure:  SPHINCTEROTOMY;  Surgeon: Landry Dyke, MD;  Location: [facility];  Service: Endoscopy;  Laterality: N/A;  . SURGICAL REPAIR ZYGOMATIC ARCH  1977 OR 1978    FAMILY HISTORY Family History  Problem Relation Age of Onset  . Stroke Mother   . Prostate cancer Father   . Glaucoma Father   . Kidney failure Father   . Thyroid disease Sister   . Diverticulitis Sister   . Thyroid disease Sister   . Thyroid disease Sister   . Migraines Brother   . Colon cancer Neg Hx     SOCIAL HISTORY Social History   Tobacco Use  . Smoking status: Never Smoker  . Smokeless tobacco: Never Used  Vaping Use  . Vaping Use: Never used  Substance Use Topics  . Alcohol use: Yes    Comment: socially  . Drug use: Never         OPHTHALMIC EXAM: Base Eye Exam    Visual Acuity (Snellen - Linear)      Right Left   Dist cc 20/40 20/25 -2   Dist ph cc 20/30 -2    Correction: Glasses       Tonometry (Tonopen, 8:41 AM)      Right Left   Pressure 17 15       Pupils      Pupils Dark Light Shape React APD   Right PERRL 2 2 Round Minimal None   Left PERRL 2 2 Round Minimal None       Visual Fields (Counting fingers)      Left Right    Full Full       Neuro/Psych    Oriented x3: Yes   Mood/Affect: Normal       Dilation    Both eyes: 1.0% Mydriacyl, 2.5% Phenylephrine @ 8:41 AM        Slit Lamp and Fundus Exam    External Exam      Right Left   External Normal Normal       Slit Lamp Exam      Right Left   Lids/Lashes Normal Normal   Conjunctiva/Sclera White and quiet White and quiet   Cornea Clear Clear   Anterior Chamber Deep and quiet Deep and quiet   Iris Round and reactive Round and reactive   Lens 2+ Nuclear sclerosis 1+ Nuclear sclerosis   Anterior Vitreous Normal Normal       Fundus Exam      Right Left   Posterior Vitreous Posterior vitreous detachment Posterior vitreous detachment   Disc Normal Normal   C/D Ratio 0.25 0.35   Macula Normal Normal   Vessels  Normal Normal   Periphery Good retinopexy around 3:00 tear Normal, no holes or tears  IMAGING AND PROCEDURES  Imaging and Procedures for 03/11/20  OCT, Retina - OU - Both Eyes       Right Eye Quality was good. Scan locations included subfoveal. Central Foveal Thickness: 273. Progression has been stable. Findings include normal foveal contour.   Left Eye Quality was good. Scan locations included subfoveal. Central Foveal Thickness: 276. Progression has been stable. Findings include normal foveal contour.   Notes OU with incidental posterior vitreous detachment and normal foveal contour, very minimal thickening of Bruch's membrane, no drusen or deposits.                ASSESSMENT/PLAN:  Nuclear sclerotic cataract of right eye Sunglasses analogy discussed as to describe the for symptoms that he may have in the future  Clear overall now with good acuity  Retinal break of right eye Good laser retinopexy around retinal break temporally, will continue to observe      ICD-10-CM   1. Nuclear sclerotic cataract of right eye  H25.11   2. Nuclear sclerotic cataract of left eye  H25.12   3. Retinal break of right eye  H33.301   4. Posterior vitreous detachment of right eye  H43.811 OCT, Retina - OU - Both Eyes  5. Posterior vitreous detachment of left eye  H43.812     1.  Patient asked return to general ophthalmologist as scheduled, [facility]  2.  3.  Ophthalmic Meds Ordered this visit:  No orders of the defined types were placed in this encounter.      Return in about 2 years (around 03/11/2022) for DILATE OU, COLOR FP.  There are no Patient Instructions on file for this visit.   Explained the diagnoses, plan, and follow up with the patient and they expressed understanding.  Patient expressed understanding of the importance of proper follow up care.   Clent Demark Kaiah Hosea M.D. Diseases & Surgery of the Retina and Vitreous Retina &  Diabetic [place] 03/11/20     Abbreviations: M myopia (nearsighted); A astigmatism; H hyperopia (farsighted); P presbyopia; Mrx spectacle prescription;  CTL contact lenses; OD right eye; OS left eye; OU both eyes  XT exotropia; ET esotropia; PEK punctate epithelial keratitis; PEE punctate epithelial erosions; DES dry eye syndrome; MGD meibomian gland dysfunction; ATs artificial tears; PFAT's preservative free artificial tears; [place] nuclear sclerotic cataract; PSC posterior subcapsular cataract; ERM epi-retinal membrane; PVD posterior vitreous detachment; RD retinal detachment; DM diabetes mellitus; DR diabetic retinopathy; NPDR non-proliferative diabetic retinopathy; PDR proliferative diabetic retinopathy; CSME clinically significant macular edema; DME diabetic macular edema; dbh dot blot hemorrhages; CWS cotton wool spot; POAG primary open angle glaucoma; C/D cup-to-disc ratio; HVF humphrey visual field; GVF goldmann visual field; OCT optical coherence tomography; IOP intraocular pressure; BRVO Branch retinal vein occlusion; CRVO central retinal vein occlusion; CRAO central retinal artery occlusion; BRAO branch retinal artery occlusion; RT retinal tear; SB scleral buckle; PPV pars plana vitrectomy; VH Vitreous hemorrhage; PRP panretinal laser photocoagulation; IVK intravitreal kenalog; VMT vitreomacular traction; MH Macular hole;  NVD neovascularization of the disc; NVE neovascularization elsewhere; AREDS age related eye disease study; ARMD age related macular degeneration; POAG primary open angle glaucoma; EBMD epithelial/anterior basement membrane dystrophy; ACIOL anterior chamber intraocular lens; IOL intraocular lens; PCIOL posterior chamber intraocular lens; Phaco/IOL phacoemulsification with intraocular lens placement; [place] photorefractive keratectomy; LASIK laser assisted in situ keratomileusis; HTN hypertension; DM diabetes mellitus; COPD chronic obstructive pulmonary disease

## 2020-04-29 ENCOUNTER — Other Ambulatory Visit: Payer: Self-pay | Admitting: General Surgery

## 2020-04-29 DIAGNOSIS — R1031 Right lower quadrant pain: Secondary | ICD-10-CM

## 2020-05-24 ENCOUNTER — Ambulatory Visit
Admission: RE | Admit: 2020-05-24 | Discharge: 2020-05-24 | Disposition: A | Discharge status: Home / Self Care | Payer: BC Managed Care – PPO | Source: Ambulatory Visit | Attending: General Surgery | Admitting: General Surgery

## 2020-05-24 DIAGNOSIS — R1031 Right lower quadrant pain: Secondary | ICD-10-CM

## 2020-05-24 MED ORDER — IOPAMIDOL (ISOVUE-300) INJECTION 61%
100.0000 mL | Freq: Once | INTRAVENOUS | Status: AC | PRN
  Administered 2020-05-24: 100 mL via INTRAVENOUS

## 2020-11-03 ENCOUNTER — Encounter: Payer: Self-pay | Admitting: *Deleted

## 2020-11-09 ENCOUNTER — Encounter: Payer: Self-pay | Admitting: Diagnostic Neuroimaging

## 2020-11-09 ENCOUNTER — Ambulatory Visit: Payer: BC Managed Care – PPO | Admitting: Diagnostic Neuroimaging

## 2020-11-09 VITALS — BP 117/79 | HR 71 | Ht 73.0 in | Wt 200.0 lb

## 2020-11-09 DIAGNOSIS — G609 Hereditary and idiopathic neuropathy, unspecified: Secondary | ICD-10-CM

## 2020-11-09 NOTE — Progress Notes (Signed)
[facility]  PATIENT: Jeffrey Li DOB: 29-Mar-1951  REFERRING CLINICIAN: [place] Pepper, MD HISTORY FROM: patient  REASON FOR VISIT: new consult   HISTORICAL  CHIEF COMPLAINT:  Chief Complaint  Patient presents with   Numbness    RM 6 alone Pt is well, states his hands have gotten more affected recently, also experiencing some symptoms in his feet. Having superficial numbness, coldness and pain in feet if he walks too much     HISTORY OF PRESENT ILLNESS:   70 year old male here for evaluation of neuropathy.  Symptoms started in 2013 with mild progression of cold and burning sensation in the toes.  This is progressively worsened over time.  Now bottom of feet up to the midfoot are affected.  He has some cold sensation in his fingers.  He had EMG and neuropathy lab testing in 2019 which confirmed axonal neuropathy but no specific cause was found.  He tried acupuncture therapy which helped for a while.  Unfortunately his acupuncture that practitioner has retired and he has not been able to find a suitable replacement.  Fortunately patient does not have significant pain symptoms unless he walks for a long time such as 1 mile or longer.  He is mainly doing swimming exercises.    REVIEW OF SYSTEMS: Full 14 system review of systems performed and negative with exception of: as per HPI.  ALLERGIES: Allergies  Allergen Reactions   Other     Melons including cucumbers-stomach  upset   Shellfish Allergy Nausea And Vomiting    mollusk    HOME MEDICATIONS: Outpatient Medications Prior to Visit  Medication Sig Dispense Refill   acetaminophen (TYLENOL) 500 MG tablet Take 500 mg by mouth 2 (two) times daily as needed.     allopurinol (ZYLOPRIM) 300 MG tablet Take 150 mg by mouth at bedtime.      Alpha-Lipoic Acid 300 MG CAPS Take 2 capsules by mouth daily.     B COMPLEX-BIOTIN-FA PO Take 1 capsule by mouth daily.      Coenzyme Q10 (CO Q 10 PO) Take 400 mg by mouth daily.      fluocinonide ointment (LIDEX) [id number] % Apply 1 application topically as needed.     ibuprofen (ADVIL,MOTRIN) 200 MG tablet Take 200 mg by mouth once.     loratadine (CLARITIN) 10 MG tablet Take 10 mg by mouth daily as needed.      Multiple Vitamins-Minerals (MULTIVITAMINS THER. W/MINERALS) TABS Take 1 tablet by mouth daily.       Omega-3 Fatty Acids (OMEGA-3 FISH OIL PO) Take 120 mg by mouth 3 (three) times daily.     omeprazole (PRILOSEC) 20 MG capsule Take 20 mg by mouth daily.     sildenafil (REVATIO) 20 MG tablet Take 20 mg by mouth as needed. Take 2-5 tablets when needed     vitamin B-12 (CYANOCOBALAMIN) 1000 MCG tablet Take 1,000 mcg by mouth daily.     MAGNESIUM-CALCIUM-FOLIC ACID PO Take [id number] mg by mouth daily. (Patient not taking: Reported on 11/09/2020)     oxyCODONE (OXY IR/ROXICODONE) 5 MG immediate release tablet Take 1 tablet (5 mg total) by mouth every 6 (six) hours as needed for severe pain. (Patient not taking: Reported on 11/09/2020) 15 tablet 0   VITAMIN E PO Take 2 capsules by mouth daily. '450mg'$ - 2 capsules daily (Patient not taking: Reported on 11/09/2020)     Facility-Administered Medications Prior to Visit  Medication Dose Route Frequency Provider Last Rate Last Admin   gadopentetate  dimeglumine (MAGNEVIST) injection 20 mL  20 mL Intravenous Once PRN Kathrynn Ducking, MD        PAST MEDICAL HISTORY: Past Medical History:  Diagnosis Date   Arthritis    FINGERS , OA IN BACK    Chronic low back pain    Complication of anesthesia 1970   "TOO MUCH ANESTHETIC AFTER TRASHING AROUND HAD ANESTHESIA OVERDOSE"   Erectile dysfunction    GERD (gastroesophageal reflux disease)    Gout    Hyperlipidemia    Migraine headache    NONE IN 20 YEARS   Peripheral neuropathy 07/03/2017   TOES AND FEET   Sleep apnea    NONE WITH LATEST TEST 20-19    PAST SURGICAL HISTORY: Past Surgical History:  Procedure Laterality Date   ERCP  03/14/2011   Procedure: ENDOSCOPIC RETROGRADE  CHOLANGIOPANCREATOGRAPHY (ERCP);  Surgeon: Landry Dyke, MD;  Location: [facility];  Service: Endoscopy;  Laterality: N/A;   INGUINAL HERNIA REPAIR Bilateral 10/24/2018   Procedure: LAPAROSCOPIC BILATERAL INGUINAL HERNIAS REPAIR WITH MESH;  Surgeon: Greer Pickerel, MD;  Location: [facility];  Service: General;  Laterality: Bilateral;   left knee  [place] Right 2016   right shoulder  1970   Repair of torn AC joint right shoulder   SPHINCTEROTOMY  03/14/2011   Procedure: SPHINCTEROTOMY;  Surgeon: Landry Dyke, MD;  Location: [facility];  Service: Endoscopy;  Laterality: N/A;   SURGICAL REPAIR ZYGOMATIC ARCH  1977 OR 1978    FAMILY HISTORY: Family History  Problem Relation Age of Onset   Stroke Mother    Prostate cancer Father    Glaucoma Father    Kidney failure Father    Thyroid disease Sister    Diverticulitis Sister    Thyroid disease Sister    Thyroid disease Sister    Migraines Brother    Liver disease Maternal Grandfather    Colon cancer Neg Hx     SOCIAL HISTORY: Social History   Socioeconomic History   Marital status: Married    Spouse name: Not on file   Number of children: 6   Years of education: Not on file   Highest education level: Doctorate  Occupational History   Not on file  Tobacco Use   Smoking status: Never   Smokeless tobacco: Never  Vaping Use   Vaping Use: Never used  Substance and Sexual Activity   Alcohol use: Yes    Comment: socially   Drug use: Never   Sexual activity: Not on file  Other Topics Concern   Not on file  Social History Narrative   Lives with wife   Caffeine use: 1 cup coffee per day   Left handed    Social Determinants of Health   Financial Resource Strain: Not on file  Food Insecurity: Not on file  Transportation Needs: Not on file  Physical Activity: Not on file  Stress: Not on file  Social Connections: Not on file  Intimate Partner Violence: Not on file     PHYSICAL  EXAM  GENERAL EXAM/CONSTITUTIONAL: Vitals:  Vitals:   11/09/20 1245  BP: 117/79  Pulse: 71  Weight: 200 lb (90.7 kg)  Height: '6\' 1"'$  (1.854 m)   Body mass index is 26.39 kg/m. Wt Readings from Last 3 Encounters:  11/09/20 200 lb (90.7 kg)  10/24/18 219 lb 3.2 oz (99.4 kg)  10/17/18 219 lb 3.2 oz (99.4 kg)   Patient is in no distress; well  developed, nourished and groomed; neck is supple  CARDIOVASCULAR: Examination of carotid arteries is normal; no carotid bruits Regular rate and rhythm, no murmurs Examination of peripheral vascular system by observation and palpation is normal  EYES: Ophthalmoscopic exam of optic discs and posterior segments is normal; no papilledema or hemorrhages No results found.  MUSCULOSKELETAL: Gait, strength, tone, movements noted in Neurologic exam below  NEUROLOGIC: MENTAL STATUS:  No flowsheet data found. awake, alert, oriented to person, place and time recent and remote memory intact normal attention and concentration language fluent, comprehension intact, naming intact fund of knowledge appropriate  CRANIAL NERVE:  2nd - no papilledema on fundoscopic exam 2nd, 3rd, 4th, 6th - pupils equal and reactive to light, visual fields full to confrontation, extraocular muscles intact, no nystagmus 5th - facial sensation symmetric 7th - facial strength symmetric 8th - hearing intact 9th - palate elevates symmetrically, uvula midline 11th - shoulder shrug symmetric 12th - tongue protrusion midline  MOTOR:  normal bulk and tone, full strength in the BUE, BLE  SENSORY:  normal and symmetric to light touch, temperature, vibration  COORDINATION:  finger-nose-finger, fine finger movements normal  REFLEXES:  deep tendon reflexes TRACE and symmetric  GAIT/STATION:  narrow based gait     DIAGNOSTIC DATA (LABS, IMAGING, TESTING) - I reviewed patient records, labs, notes, testing and imaging myself where available.  Lab Results   Component Value Date   WBC 5.2 10/17/2018   HGB 16.2 10/17/2018   HCT 48.2 10/17/2018   MCV 91.3 10/17/2018   PLT 216 10/17/2018      Component Value Date/Time   NA 143 10/24/2018 0942   K 4.1 10/24/2018 0942   CL 108 10/24/2018 0942   CO2 25 10/24/2018 0942   GLUCOSE 114 (H) 10/24/2018 0942   BUN 27 (H) 10/24/2018 0942   CREATININE 1.08 10/24/2018 0942   CALCIUM 9.4 10/24/2018 0942   PROT 6.3 06/07/2017 0903   ALBUMIN 3.9 03/10/2011 1621   AST 244 (H) 03/10/2011 1621   ALT 380 (H) 03/10/2011 1621   ALKPHOS 184 (H) 03/10/2011 1621   BILITOT 4.6 (H) 03/10/2011 1621   GFRNONAA >60 10/24/2018 0942   GFRAA >60 10/24/2018 0942   No results found for: CHOL, HDL, LDLCALC, LDLDIRECT, TRIG, CHOLHDL No results found for: HGBA1C No results found for: VITAMINB12 No results found for: TSH   07/03/17 EMG/NCS - Nerve conduction studies done on the right upper extremity and both lower extremities shows evidence of a 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  There is evidence of an overlying mild right carpal tunnel syndrome.  EMG evaluation of the right lower extremity shows findings that are consistent with an acute mild S1 radiculopathy with some chronic changes at the L5 level as well.  07/11/17 MRI lumbar spine (without) demonstrating: - Minimal disc bulging at T12-L1, L1-2, L4-5. no spinal stenosis or foraminal narrowing - No significant change from MRI on 11/16/11.     ASSESSMENT AND PLAN  70 y.o. year old male here with idiopathic neuropathy since 2013.  Dx:  1. Idiopathic neuropathy     PLAN:  IDIOPATHIC NEUROPATHY - continue supportive care; may consider topical creams, gabapentin, duloxetine  Return for return to PCP, pending if symptoms worsen or fail to improve.    Penni Bombard, MD [id number], [id number] PM Certified in Neurology, Neurophysiology and Neuroimaging  [facility] [address] [place], [place]  [address] [phone]

## 2021-09-15 ENCOUNTER — Other Ambulatory Visit: Payer: Self-pay | Admitting: Family Medicine

## 2021-09-15 ENCOUNTER — Other Ambulatory Visit (HOSPITAL_COMMUNITY): Payer: Self-pay | Admitting: Family Medicine

## 2021-09-15 DIAGNOSIS — E785 Hyperlipidemia, unspecified: Secondary | ICD-10-CM

## 2021-10-12 ENCOUNTER — Ambulatory Visit (HOSPITAL_COMMUNITY)
Admission: RE | Admit: 2021-10-12 | Discharge: 2021-10-12 | Disposition: A | Discharge status: Home / Self Care | Payer: Self-pay | Source: Ambulatory Visit | Attending: Family Medicine | Admitting: Family Medicine

## 2021-10-12 DIAGNOSIS — E785 Hyperlipidemia, unspecified: Secondary | ICD-10-CM | POA: Insufficient documentation

## 2021-11-19 IMAGING — CT CT PELVIS W/ CM
2 of 3 series · 14 of 46 positions shown, 16 images · IV contrast (iopamidol)
Comparison: May 31, 2018.

CLINICAL DATA: Chronic right inguinal pain.

EXAM:
CT PELVIS WITH CONTRAST
TECHNIQUE: Multidetector CT imaging of the pelvis was performed using the
standard protocol following the bolus administration of intravenous
contrast.
CONTRAST:  100mL DQRDEX-4LL IOPAMIDOL (DQRDEX-4LL) INJECTION 61%

[Series 2: pelvis 5.00 br40 s3 · axial · 0.64mm/px · z∈[+1018,+1288]mm · 11 of 62 slices shown, 13 images]
[im 4/62  soft-tissue]
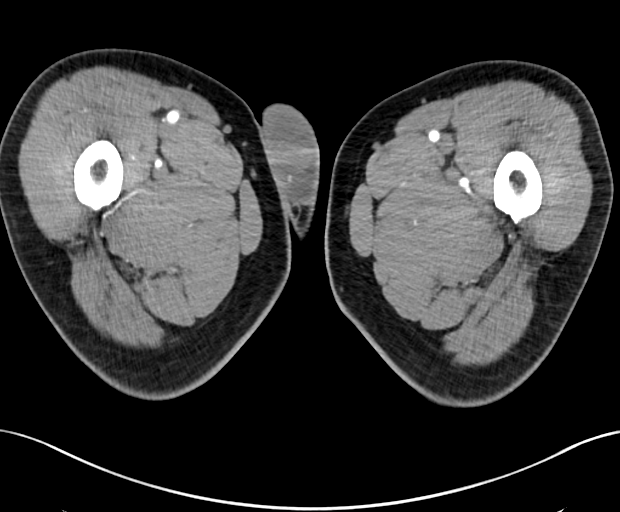
[im 4/62  bone]
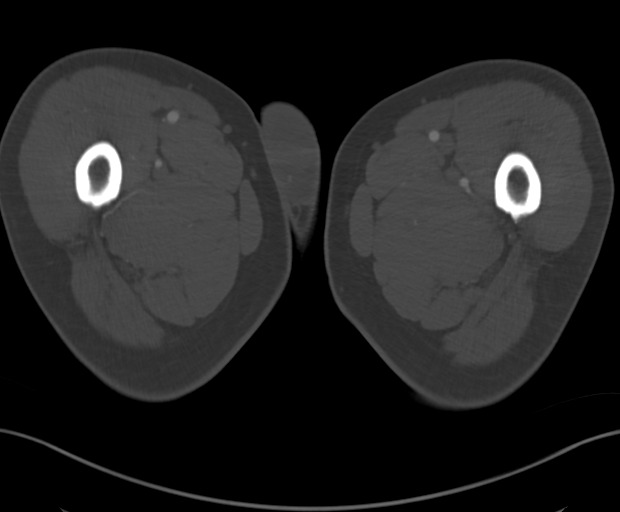
[im 10/62  soft-tissue]
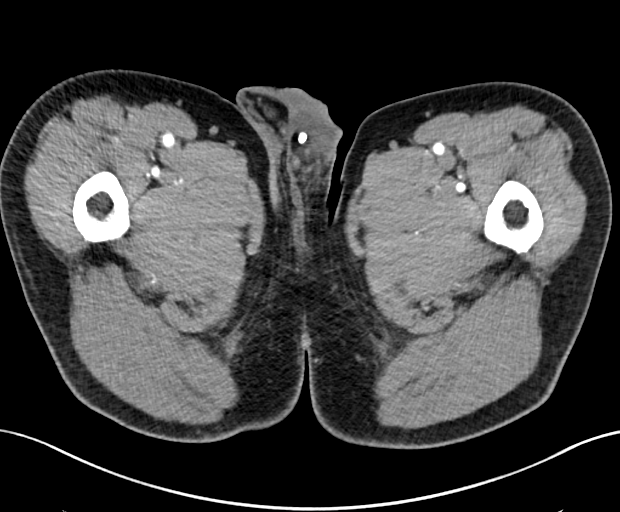
[im 14/62  soft-tissue]
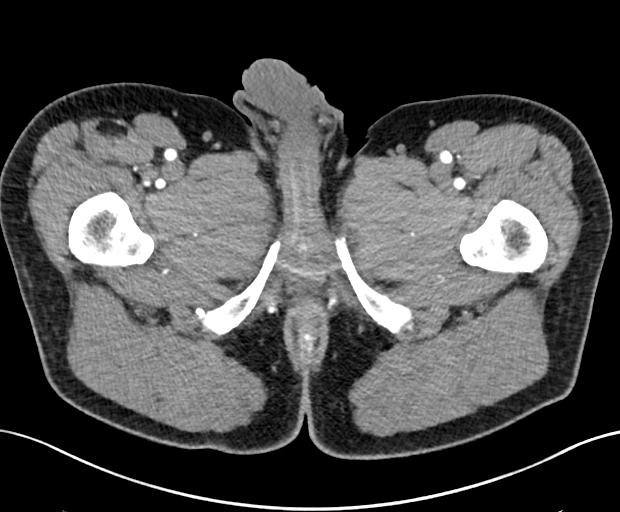
[im 20/62  soft-tissue]
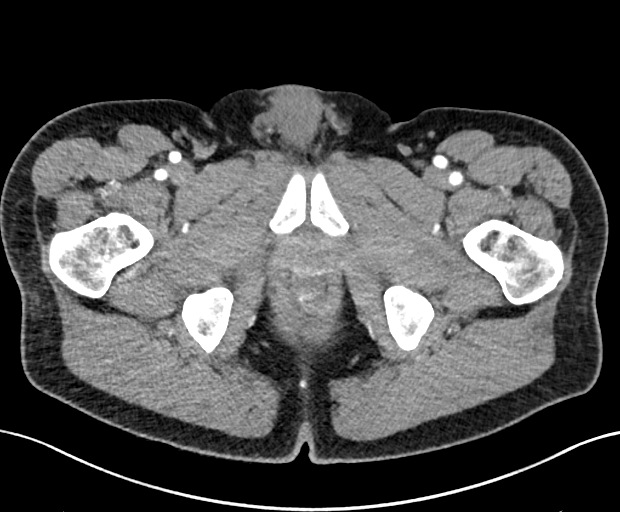
[im 26/62  soft-tissue]
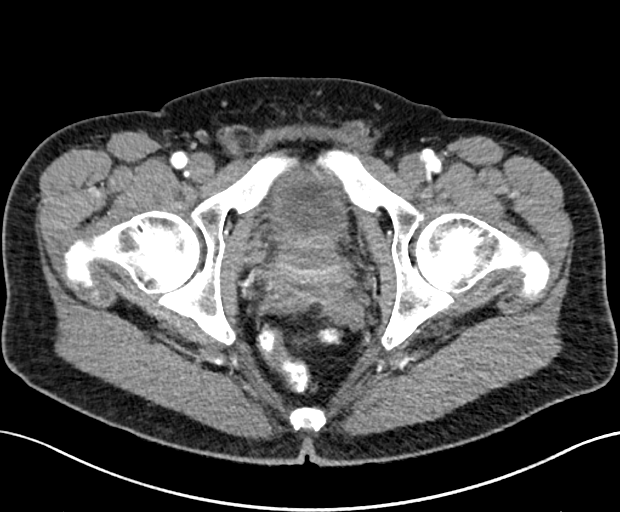
[im 32/62  soft-tissue]
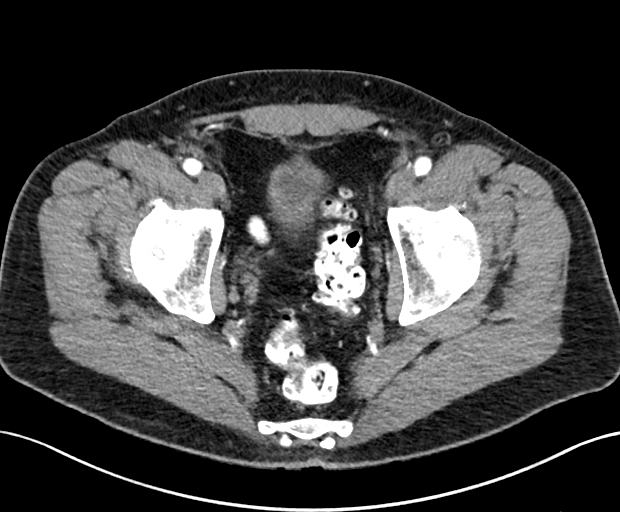
[im 36/62  soft-tissue]
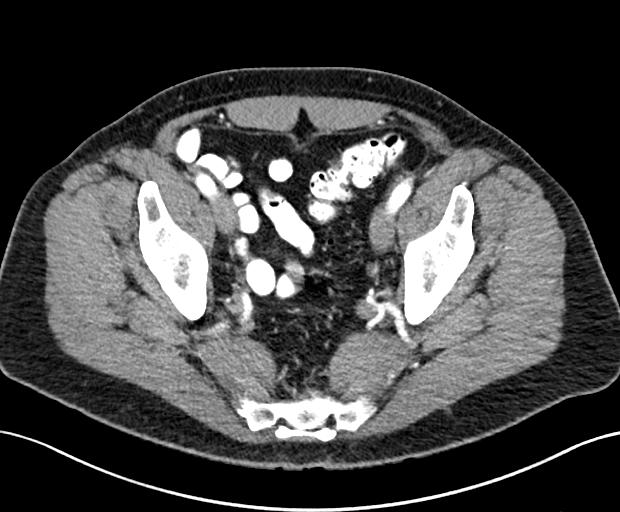
[im 42/62  soft-tissue]
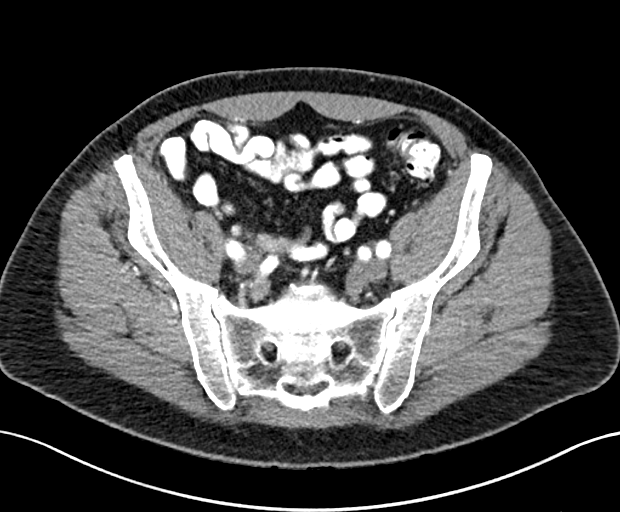
[im 48/62  soft-tissue]
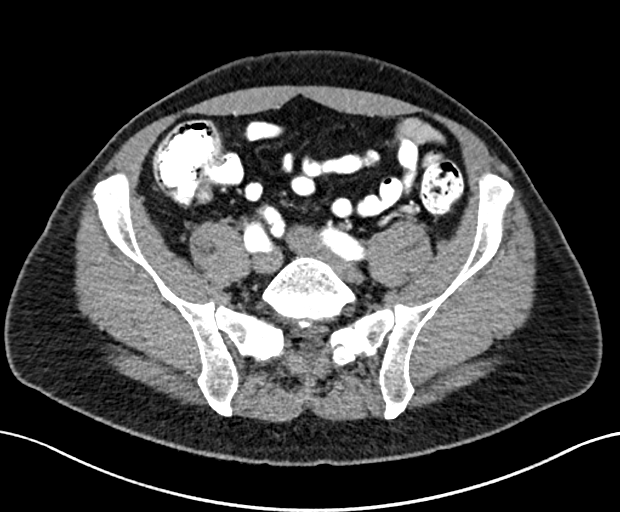
[im 48/62  bone]
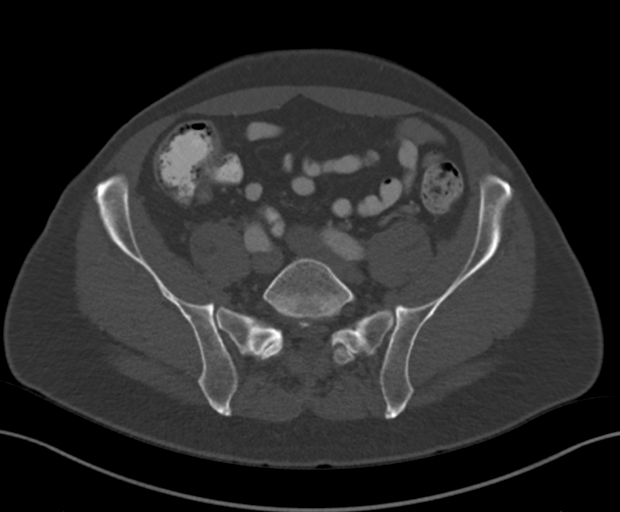
[im 52/62  soft-tissue]
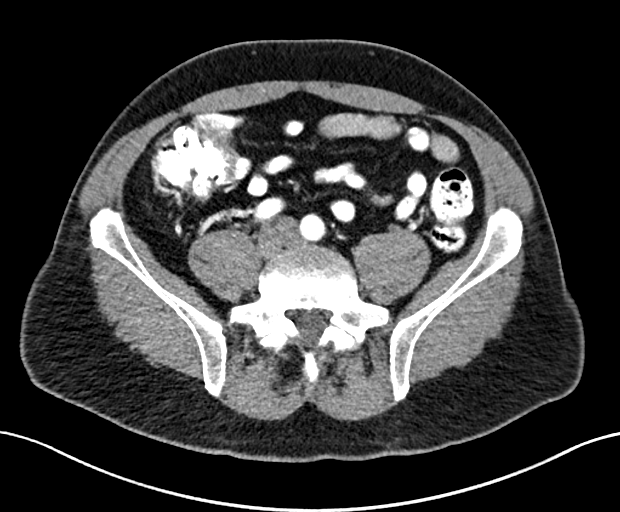
[im 58/62  soft-tissue]
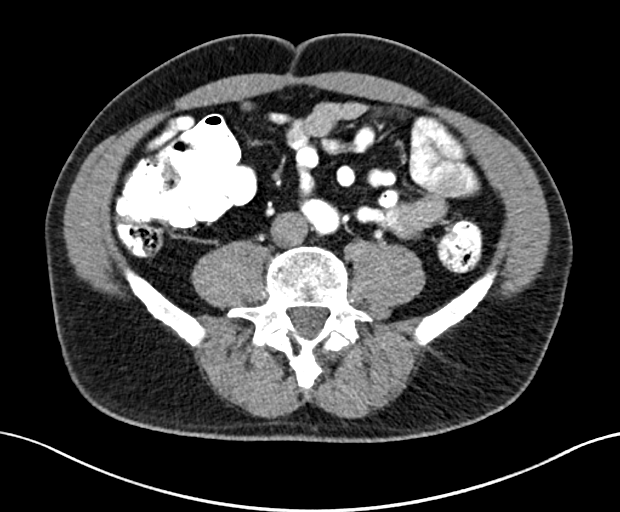

[Series 4: pelvis 2.00 br40 s3 · coronal · 0.61mm/px · 3 of 164 slices shown]
[im 55/164  soft-tissue]
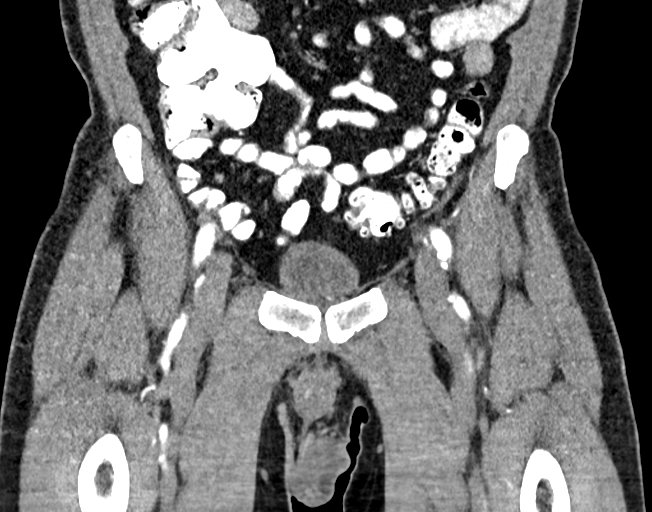
[im 73/164  soft-tissue]
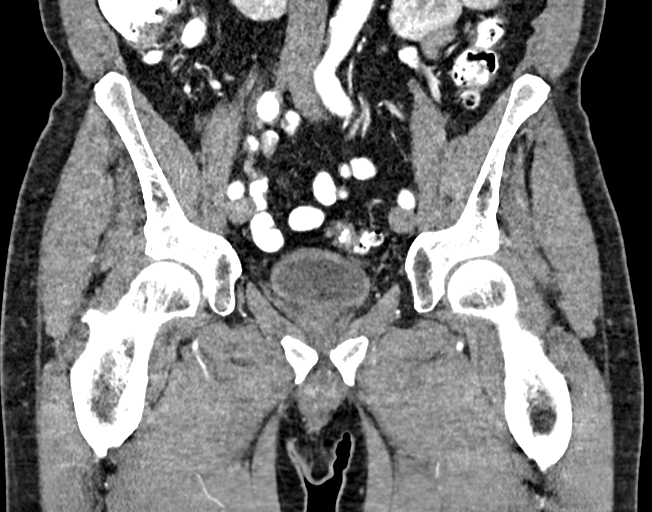
[im 91/164  soft-tissue]
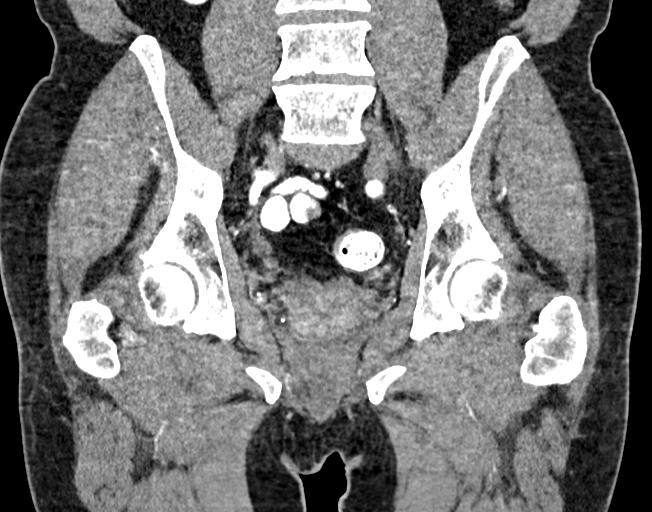

[14 of 46 positions shown; findings below may reference images not displayed]

FINDINGS: Urinary Tract:  No abnormality visualized.

Bowel:  Unremarkable visualized pelvic bowel loops.

Vascular/Lymphatic: No pathologically enlarged lymph nodes. No
significant vascular abnormality seen.

Reproductive:  No mass or other significant abnormality

Other: Status post surgical repair of bilateral inguinal hernias. No
recurrent hernia is noted. No ascites is noted.

Musculoskeletal: No suspicious bone lesions identified.
IMPRESSION: Status post surgical repair of bilateral inguinal hernias. No
significant abnormality seen in the pelvis.

## 2022-03-13 ENCOUNTER — Encounter (INDEPENDENT_AMBULATORY_CARE_PROVIDER_SITE_OTHER): Payer: BC Managed Care – PPO | Admitting: Ophthalmology

## 2022-09-25 ENCOUNTER — Other Ambulatory Visit (HOSPITAL_BASED_OUTPATIENT_CLINIC_OR_DEPARTMENT_OTHER): Payer: Self-pay | Admitting: Family Medicine

## 2022-09-25 DIAGNOSIS — I77819 Aortic ectasia, unspecified site: Secondary | ICD-10-CM

## 2022-10-02 ENCOUNTER — Ambulatory Visit (HOSPITAL_BASED_OUTPATIENT_CLINIC_OR_DEPARTMENT_OTHER)
Admission: RE | Admit: 2022-10-02 | Discharge: 2022-10-02 | Disposition: A | Discharge status: Home / Self Care | Payer: BC Managed Care – PPO | Source: Ambulatory Visit | Attending: Family Medicine | Admitting: Family Medicine

## 2022-10-02 DIAGNOSIS — I77819 Aortic ectasia, unspecified site: Secondary | ICD-10-CM | POA: Insufficient documentation

## 2022-10-02 MED ORDER — IOHEXOL 350 MG/ML SOLN
100.0000 mL | Freq: Once | INTRAVENOUS | Status: AC | PRN
  Administered 2022-10-02: 100 mL via INTRAVENOUS

## 2022-12-25 ENCOUNTER — Other Ambulatory Visit: Payer: Self-pay

## 2022-12-25 ENCOUNTER — Emergency Department (HOSPITAL_BASED_OUTPATIENT_CLINIC_OR_DEPARTMENT_OTHER): Payer: Medicare PPO

## 2022-12-25 ENCOUNTER — Emergency Department (HOSPITAL_BASED_OUTPATIENT_CLINIC_OR_DEPARTMENT_OTHER)
Admission: EM | Admit: 2022-12-25 | Discharge: 2022-12-25 | Disposition: A | Discharge status: Home / Self Care | Payer: Medicare PPO | Attending: Emergency Medicine | Admitting: Emergency Medicine

## 2022-12-25 ENCOUNTER — Encounter (HOSPITAL_BASED_OUTPATIENT_CLINIC_OR_DEPARTMENT_OTHER): Payer: Self-pay

## 2022-12-25 ENCOUNTER — Emergency Department (HOSPITAL_BASED_OUTPATIENT_CLINIC_OR_DEPARTMENT_OTHER): Payer: Medicare PPO | Admitting: Radiology

## 2022-12-25 DIAGNOSIS — R0602 Shortness of breath: Secondary | ICD-10-CM | POA: Insufficient documentation

## 2022-12-25 DIAGNOSIS — R079 Chest pain, unspecified: Secondary | ICD-10-CM

## 2022-12-25 DIAGNOSIS — R072 Precordial pain: Secondary | ICD-10-CM | POA: Insufficient documentation

## 2022-12-25 LAB — BASIC METABOLIC PANEL
Anion gap: 9 (ref 5–15)
BUN: 21 mg/dL (ref 8–23)
CO2: 24 mmol/L (ref 22–32)
Calcium: 9.2 mg/dL (ref 8.9–10.3)
Chloride: 107 mmol/L (ref 98–111)
Creatinine, Ser: 0.93 mg/dL (ref 0.61–1.24)
GFR, Estimated: >60 mL/min (ref >60)
Glucose, Bld: 109 mg/dL — ABNORMAL HIGH (ref 70–99)
Potassium: 3.8 mmol/L (ref 3.5–5.1)
Sodium: 140 mmol/L (ref 135–145)

## 2022-12-25 LAB — CBC
HCT: 42.1 % (ref 39.0–52.0)
Hemoglobin: 15.2 g/dL (ref 13.0–17.0)
MCH: 31.2 pg (ref 26.0–34.0)
MCHC: 36.1 g/dL — ABNORMAL HIGH (ref 30.0–36.0)
MCV: 86.4 fL (ref 80.0–100.0)
Platelets: 161 10*3/uL (ref 150–400)
RBC: 4.87 MIL/uL (ref 4.22–5.81)
RDW: 13.1 % (ref 11.5–15.5)
WBC: 6.8 10*3/uL (ref 4.0–10.5)
nRBC: 0 % (ref 0.0–0.2)

## 2022-12-25 LAB — TROPONIN I (HIGH SENSITIVITY)
Troponin I (High Sensitivity): 7 ng/L (ref <18)
Troponin I (High Sensitivity): 8 ng/L (ref <18)

## 2022-12-25 MED ORDER — ASPIRIN 81 MG PO CHEW
162.0000 mg | CHEWABLE_TABLET | Freq: Once | ORAL | Status: AC
  Administered 2022-12-25: 162 mg via ORAL
  Filled 2022-12-25: qty 2

## 2022-12-25 MED ORDER — IOHEXOL 350 MG/ML SOLN
100.0000 mL | Freq: Once | INTRAVENOUS | Status: AC | PRN
  Administered 2022-12-25: 100 mL via INTRAVENOUS

## 2022-12-25 NOTE — ED Notes (Signed)
Report given to the next RN... 

## 2022-12-25 NOTE — ED Provider Notes (Signed)
[place] EMERGENCY DEPARTMENT AT [facility] Provider Note   CSN: [id number] Arrival date & time: 12/25/22  1709     History  Chief Complaint  Patient presents with   Chest Pain    Jeffrey Li is a 72 y.o. male.  With past history of thoracic aortic aneurysm who presents to the ED for chest pain.  Patient first experienced chest pain this afternoon while at rest.  He was reading an article in his chair at home and first experienced substernal chest pain which radiated up through the right side of his neck and through the chest.  Pain has persisted since the onset.  He took 2X 81 mg Jeffrey at home prior to arrival.  No other medications.  Still reporting chest pain here as well as some shortness of breath.  No nausea vomiting diaphoresis fevers chills or recent illness   Chest Pain Associated symptoms: shortness of breath        Home Medications Prior to Admission medications   Medication Sig Start Date End Date Taking? Authorizing Provider  acetaminophen (TYLENOL) 500 MG tablet Take 500 mg by mouth 2 (two) times daily as needed.    [provider]  allopurinol (ZYLOPRIM) 300 MG tablet Take 150 mg by mouth at bedtime.     [provider]  Alpha-Lipoic Acid 300 MG CAPS Take 2 capsules by mouth daily.    [provider]  B COMPLEX-BIOTIN-FA PO Take 1 capsule by mouth daily.     [provider]  Coenzyme Q10 (CO Q 10 PO) Take 400 mg by mouth daily.    [provider]  fluocinonide ointment (LIDEX) 0.05 % Apply 1 application topically as needed.    [provider]  ibuprofen (ADVIL,MOTRIN) 200 MG tablet Take 200 mg by mouth once.    [provider]  loratadine (CLARITIN) 10 MG tablet Take 10 mg by mouth daily as needed.     [provider]  Multiple Vitamins-Minerals (MULTIVITAMINS THER. W/MINERALS) TABS Take 1 tablet by mouth daily.      [provider]  Omega-3 Fatty Acids (OMEGA-3 FISH OIL  PO) Take 120 mg by mouth 3 (three) times daily.    [provider]  omeprazole (PRILOSEC) 20 MG capsule Take 20 mg by mouth daily.    [provider]  sildenafil (REVATIO) 20 MG tablet Take 20 mg by mouth as needed. Take 2-5 tablets when needed    [provider]  vitamin B-12 (CYANOCOBALAMIN) 1000 MCG tablet Take 1,000 mcg by mouth daily.    [provider]      Allergies    Other and Shellfish allergy    Review of Systems   Review of Systems  Respiratory:  Positive for shortness of breath.   Cardiovascular:  Positive for chest pain.  All other systems reviewed and are negative.   Physical Exam Updated Vital Signs BP (!) 136/97   Pulse 85   Temp 99 F (37.2 C) (Oral)   Resp 16   Ht 6\' 1"  (1.854 m)   Wt 90.7 kg   SpO2 98%   BMI 26.39 kg/m  Physical Exam Vitals and nursing note reviewed.  HENT:     Head: Normocephalic and atraumatic.  Eyes:     Pupils: Pupils are equal, round, and reactive to light.  Cardiovascular:     Rate and Rhythm: Normal rate and regular rhythm.  Pulmonary:     Effort: Pulmonary effort is normal.  Breath sounds: Normal breath sounds.  Abdominal:     Palpations: Abdomen is soft.     Tenderness: There is no abdominal tenderness.  Skin:    General: Skin is warm and dry.  Neurological:     Mental Status: He is alert.  Psychiatric:        Mood and Affect: Mood normal.     ED Results / Procedures / Treatments   Labs (all labs ordered are listed, but only abnormal results are displayed) Labs Reviewed  BASIC METABOLIC PANEL - Abnormal; Notable for the following components:      Result Value   Glucose, Bld 109 (*)    All other components within normal limits  CBC - Abnormal; Notable for the following components:   MCHC 36.1 (*)    All other components within normal limits  TROPONIN I (HIGH SENSITIVITY)  TROPONIN I (HIGH SENSITIVITY)    EKG EKG Interpretation Date/Time:  Monday December 25 2022  17:16:54 EDT Ventricular Rate:  82 PR Interval:  180 QRS Duration:  72 QT Interval:  356 QTC Calculation: 415 R Axis:   64  Text Interpretation: Sinus rhythm with Premature atrial complexes Otherwise normal ECG No previous ECGs available Confirmed by Estelle June [phone]) on 12/25/2022 11:51:48 PM  Radiology CT Angio Chest/Abd/Pel for Dissection W and/or Wo Contrast  Result Date: 12/25/2022 CLINICAL DATA:  Aortic aneurysm suspected. History of thoracic aortic aneurysm. Chest pain radiating to neck and shortness of breath. Clinical concern for dissection. EXAM: CT ANGIOGRAPHY CHEST, ABDOMEN AND PELVIS TECHNIQUE: Non-contrast CT of the chest was initially obtained. Multidetector CT imaging through the chest, abdomen and pelvis was performed using the standard protocol during bolus administration of intravenous contrast. Multiplanar reconstructed images and MIPs were obtained and reviewed to evaluate the vascular anatomy. RADIATION DOSE REDUCTION: This exam was performed according to the departmental dose-optimization program which includes automated exposure control, adjustment of the mA and/or kV according to patient size and/or use of iterative reconstruction technique. CONTRAST:  OMNIPAQUE IOHEXOL 350 MG/ML SOLN COMPARISON:  10/02/2022. FINDINGS: CTA CHEST FINDINGS Cardiovascular: Heart is normal in size and there is a trace pericardial effusion. Minimal coronary artery calcification is noted. There is atherosclerotic calcification of the aorta with aneurysmal dilatation of the ascending aorta measuring 4.0 cm. No dissection is seen. Pulmonary trunk is normal in caliber Mediastinum/Nodes: Calcified lymph nodes are noted in the mediastinum. No hilar or axillary lymphadenopathy. The thyroid gland, trachea, and esophagus are within normal limits. Lungs/Pleura: Pleural and parenchymal scarring is noted at the lung apices bilaterally. Mild atelectasis is present bilaterally. No consolidation,  effusion, or pneumothorax. Musculoskeletal: An old healed fracture of the right clavicle is noted. Degenerative changes are present in the thoracic spine. No acute osseous abnormality. Review of the MIP images confirms the above findings. CTA ABDOMEN AND PELVIS FINDINGS VASCULAR Aorta: Normal caliber aorta without aneurysm, dissection, vasculitis or significant stenosis. Aortic atherosclerosis. Celiac: Patent without evidence of aneurysm, dissection, vasculitis or significant stenosis. SMA: Patent. There is mild-to-moderate narrowing and irregularity of the proximal SMA. No surrounding fat stranding is seen. No aneurysm is seen. Renals: Both renal arteries are patent without evidence of aneurysm, dissection, vasculitis, fibromuscular dysplasia or significant stenosis. IMA: Patent without evidence of aneurysm, dissection, vasculitis or significant stenosis. Inflow: Patent without evidence of aneurysm, dissection, vasculitis or significant stenosis. Veins: No obvious venous abnormality within the limitations of this arterial phase study. Review of the MIP images confirms the above findings. NON-VASCULAR Hepatobiliary: No focal liver abnormality  is seen. No gallstones, gallbladder wall thickening, or biliary dilatation. Pancreas: Unremarkable. No pancreatic ductal dilatation or surrounding inflammatory changes. Spleen: Normal in size without focal abnormality. Adrenals/Urinary Tract: The adrenal glands are within normal limits. The kidneys enhance symmetrically. No renal calculus or hydronephrosis. The bladder is unremarkable. Stomach/Bowel: Stomach is within normal limits. Appendix is not seen. No evidence of bowel wall thickening, distention, or inflammatory changes. No free air or pneumatosis. Scattered diverticula are present along the sigmoid colon without evidence of diverticulitis. Lymphatic: No abdominal or pelvic lymphadenopathy. Reproductive: The prostate gland is mildly enlarged. Other: No abdominopelvic  ascites. A small fat containing umbilical hernia is present. Musculoskeletal: Degenerative changes are present in the lumbar spine. 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 No acute osseous abnormality. Review of the MIP images confirms the above findings. IMPRESSION: 1. Aortic atherosclerosis with aneurysmal dilatation of the ascending aorta measuring 4.0 cm, unchanged. No dissection is seen. Recommend annual imaging followup by CTA or MRA. This recommendation follows 2010 ACCF/AHA/AATS/ACR/Jeffrey/SCA/SCAI/SIR/STS/SVM Guidelines for the Diagnosis and Management of Patients with Thoracic Aortic Disease. Circulation. 2010; 121: Z610-R604. Aortic aneurysm NOS (ICD10-I71.9) 2. Mild to moderate narrowing and irregularity involving proximal SMA, possibly representing thrombus or motion artifact. 3. No acute process in the chest, abdomen, or pelvis. 4. Coronary artery calcification. 5. Sigmoid diverticulosis without diverticulitis. Electronically Signed   By: Thornell Sartorius M.D.   On: 12/25/2022 22:40   DG Chest 2 View  Result Date: 12/25/2022 CLINICAL DATA:  Chest pain EXAM: CHEST - 2 VIEW COMPARISON:  10/02/2022 FINDINGS: The heart size and mediastinal contours are within normal limits. No focal airspace consolidation, pleural effusion, or pneumothorax. Old healed right clavicular fracture. IMPRESSION: No active cardiopulmonary disease. Electronically Signed   By: Duanne Guess D.O.   On: 12/25/2022 19:31    Procedures Procedures    Medications Ordered in ED Medications  iohexol (OMNIPAQUE) 350 MG/ML injection 100 mL (100 mLs Intravenous Contrast Given 12/25/22 2049)  aspirin chewable tablet 162 mg (162 mg Oral Given 12/25/22 2224)    ED Course/ Medical Decision Making/ A&P Clinical Course as of 12/25/22 2354  Mon Dec 25, 2022  2351 EKG shows no ischemic changes.  Initial troponin of 8 with delta troponin of 7.  Low suspicion for ACS.  BMP and CBC are unremarkable.  Chest x-ray shows no acute disease and  CT chest abdomen pelvis dissection study shows stable known thoracic aortic aneurysm and chronic findings but no other acute findings.  Patient reported improvement in his chest pain and feels comfortable with plan for discharge home with close PCP follow-up.  He understands to return precautions that would be worrisome for ACS and potential aortic dissection and will return to the emergency department at once if he experiences these.  Stable for discharge at this time [MP]    Clinical Course User Index [MP] Royanne Foots, DO                                 Medical Decision Making 72 year old male with history as above including known thoracic aortic aneurysm.  My review of his EMR Includes 4 cm ascending thoracic aortic aneurysm seen on CTA of the chest on October 02, 2022.  Pulm initial assessment he is hemodynamically stable with systolicPressure in the 130s and heart rate in the 60s.  Still having some chest discomfort.  We will evaluate for ACS with high sensitive troponin  and EKG.  Given recent history of known thoracic aortic aneurysm as well as description of chest pain with radiation up to the neck we will obtain CTA of the chest and abdomen to evaluate for potential aortic dissection.  Amount and/or Complexity of Data Reviewed Labs: ordered. Radiology: ordered.  Risk OTC drugs. Prescription drug management.           Final Clinical Impression(s) / ED Diagnoses Final diagnoses:  Chest pain, unspecified type    Rx / DC Orders ED Discharge Orders     None         Royanne Foots, DO 12/25/22 2354

## 2022-12-25 NOTE — Discharge Instructions (Signed)
You were seen in the emerged part for chest pain Your EKG and blood work did not show any evidence of heart attack The CT scan taken of your chest abdomen pelvis showed a stable thoracic aortic aneurysm but no evidence of aortic dissection which is a life-threatening condition If you experience severe chest pain, trouble breathing or have any other concerning symptoms you should return to the emergency department at once Otherwise please follow-up with your primary care doctor within 1 week for reevaluation

## 2022-12-25 NOTE — ED Triage Notes (Signed)
Patient arrives with complaints of chest pain that started earlier today. Patient was doing his regular routine and started having pains in his neck/throat going into his chest.

## 2023-02-19 ENCOUNTER — Other Ambulatory Visit: Payer: Self-pay | Admitting: Urology

## 2023-02-19 DIAGNOSIS — N403 Nodular prostate with lower urinary tract symptoms: Secondary | ICD-10-CM

## 2023-02-19 DIAGNOSIS — R972 Elevated prostate specific antigen [PSA]: Secondary | ICD-10-CM

## 2023-02-27 ENCOUNTER — Ambulatory Visit: Payer: Medicare PPO | Attending: Cardiology | Admitting: Cardiology

## 2023-02-27 ENCOUNTER — Encounter: Payer: Self-pay | Admitting: Cardiology

## 2023-02-27 VITALS — BP 120/78 | HR 90 | Resp 16 | Ht 73.0 in | Wt 198.0 lb

## 2023-02-27 DIAGNOSIS — N529 Male erectile dysfunction, unspecified: Secondary | ICD-10-CM

## 2023-02-27 DIAGNOSIS — R072 Precordial pain: Secondary | ICD-10-CM

## 2023-02-27 DIAGNOSIS — I7121 Aneurysm of the ascending aorta, without rupture: Secondary | ICD-10-CM | POA: Diagnosis not present

## 2023-02-27 MED ORDER — DIPHENHYDRAMINE HCL 50 MG PO CAPS: ORAL_CAPSULE | ORAL | 0 refills | Status: DC

## 2023-02-27 MED ORDER — PREDNISONE 50 MG PO TABS: ORAL_TABLET | ORAL | 0 refills | Status: DC

## 2023-02-27 MED ORDER — METOPROLOL TARTRATE 25 MG PO TABS: 12.5000 mg | ORAL_TABLET | Freq: Two times a day (BID) | ORAL | 0 refills | Status: DC

## 2023-02-27 NOTE — Progress Notes (Signed)
Cardiology Office Note:    Date:  02/27/2023  NAME:  Jeffrey Li    MRN: [id number] DOB:  May 30, 1950   PCP:  [facility]  Former Cardiology Providers: None Primary Cardiologist:  Tessa Lerner, DO, [facility] (established care 02/27/2023) Electrophysiologist:  None   Referring MD: [facility]  Reason of Consult: Chest pain  Chief Complaint  Patient presents with   Chest Pain   New Patient (Initial Visit)    History of Present Illness:    Jeffrey Li is a 72 y.o. 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 He is being seen today for the evaluation of Chest pain at the request of Pa, [facility]  Chest pain: Index event September 2024 Substernally located from the manubrium to the epigastric region Pressure-like sensation Intensifying over 4 hours so he went to ED  Intensity 7 out of 10  Nonradiating Difficulty in breathing and worse with laying flat No improving factors No use of sublingual nitroglycerin tablet. Did use aspirin but no change in symptoms As symptom onset patient was sitting in her recliner reading a book after eating his lunch. Pain not brought on by effort related activities and does not resolve with rest  Went to the ED for further evaluation and management.  He underwent CT dissection protocol results reviewed, high sensitive troponins were negative, patient was sent home and recommended outpatient follow-up.  Does not check blood pressures at home.  No reoccurrence of chest pain since discharge.  No structured exercise program or daily routine.    Current Medications: Current Meds  Medication Sig   acetaminophen (TYLENOL) 500 MG tablet Take 500 mg by mouth 2 (two) times daily as needed.    allopurinol (ZYLOPRIM) 300 MG tablet Take 150 mg by mouth at bedtime.    Alpha-Lipoic Acid 300 MG CAPS Take 2 capsules by mouth daily.   B COMPLEX-BIOTIN-FA PO Take 1 capsule by mouth daily.    Coenzyme Q10 (CO Q 10 PO) Take 400 mg by mouth daily.   diphenhydrAMINE (BENADRYL) 50 MG capsule Take one capsule 1 hour prior to scan.   fluocinonide ointment (LIDEX) 0.05 % Apply 1 application topically as needed.   ibuprofen (ADVIL,MOTRIN) 200 MG tablet Take 200 mg by mouth once.   loratadine (CLARITIN) 10 MG tablet Take 10 mg by mouth daily as needed.    metoprolol tartrate (LOPRESSOR) 25 MG tablet Take 0.5 tablets (12.5 mg total) by mouth 2 (two) times daily. Take 90-120 minutes prior to scan. Hold for SBP less than 100 and/or heart rate less than 55. Start taking 1 week prior to scan. Stop taking once scan is complete.   Multiple Vitamins-Minerals (MULTIVITAMINS THER. W/MINERALS) TABS Take 1 tablet by mouth daily.     omeprazole (PRILOSEC) 20 MG capsule Take 20 mg by mouth daily.   predniSONE (DELTASONE) 50 MG tablet Take one tablet 13 hours, 7 hours, and 1 hour prior to scan.   sildenafil (REVATIO) 20 MG tablet Take 20 mg by mouth as needed. Take 2-5 tablets when needed   vitamin B-12 (CYANOCOBALAMIN) 1000 MCG tablet Take 1,000 mcg by mouth daily.   [DISCONTINUED] Omega-3 Fatty Acids (OMEGA-3 FISH OIL PO) Take 120 mg by mouth 3 (three) times daily.     Allergies:    Other and  Shellfish allergy   Past Medical History: Past Medical History:  Diagnosis Date   Acid reflux    Aortic dilatation (HCC)    Arthritis    FINGERS , OA IN BACK    Chronic low back pain    Complication of anesthesia 1970   "TOO MUCH ANESTHETIC AFTER TRASHING AROUND HAD ANESTHESIA OVERDOSE"   Difficulty swallowing    Elevated PSA    Erectile dysfunction    GERD (gastroesophageal reflux disease)    Gingivitis    Gout    Hearing loss    Hyperlipidemia    Idiopathic peripheral neuropathy    Migraine headache     NONE IN 20 YEARS   Nausea    Peripheral neuropathy 07/03/2017   TOES AND FEET   Sleep apnea    NONE WITH LATEST TEST 20-19    Past Surgical History: Past Surgical History:  Procedure Laterality Date   ERCP  03/14/2011   Procedure: ENDOSCOPIC RETROGRADE CHOLANGIOPANCREATOGRAPHY (ERCP);  Surgeon: Freddy Jaksch, MD;  Location: [facility];  Service: Endoscopy;  Laterality: N/A;   INGUINAL HERNIA REPAIR Bilateral 10/24/2018   Procedure: LAPAROSCOPIC BILATERAL INGUINAL HERNIAS REPAIR WITH MESH;  Surgeon: Gaynelle Adu, MD;  Location: [facility];  Service: General;  Laterality: Bilateral;   left knee  1986   SCOPE   RETINAL TEAR REPAIR CRYOTHERAPY Right 2016   right shoulder  1970   Repair of torn AC joint right shoulder   SPHINCTEROTOMY  03/14/2011   Procedure: SPHINCTEROTOMY;  Surgeon: Freddy Jaksch, MD;  Location: [facility];  Service: Endoscopy;  Laterality: N/A;   SURGICAL REPAIR ZYGOMATIC ARCH  1977 OR 1978    Social History: Social History   Tobacco Use   Smoking status: Never   Smokeless tobacco: Never  Vaping Use   Vaping status: Never Used  Substance Use Topics   Alcohol use: Yes    Comment: socially   Drug use: Never    Family History: Family History  Problem Relation Age of Onset   Stroke Mother    Prostate cancer Father    Glaucoma Father    Kidney failure Father    Thyroid disease Sister    Diverticulitis Sister    Thyroid disease Sister    Thyroid disease Sister    Migraines Brother    Liver disease Maternal Grandfather    Colon cancer Neg Hx     ROS:   Review of Systems  Cardiovascular:  Negative for chest pain, claudication, irregular heartbeat, leg swelling, near-syncope, orthopnea, palpitations, paroxysmal nocturnal dyspnea and syncope.  Respiratory:  Negative for shortness of breath.   Hematologic/Lymphatic: Negative for bleeding problem.    EKGs/Labs/Other Studies Reviewed:   EKG: EKG Interpretation Date/Time:  Tuesday February 27 2023 14:40:52 EST Ventricular Rate:  80 PR Interval:  190 QRS Duration:  76 QT Interval:  370 QTC Calculation: 426 R Axis:   54  Text Interpretation: Normal sinus rhythm with sinus arrhythmia Normal ECG When compared with ECG of 25-Dec-2022 17:16, Premature atrial complexes are no longer Present Confirmed by Tessa Lerner [phone]) on 02/27/2023 2:56:04 PM  Echocardiogram: None  CAC report 10/2021 Coronary calcium score of 48.7. This was 27th percentile for age-, race-, and sex-matched controls.  Labs:    Latest Ref Rng & Units 12/25/2022    5:22 PM 10/17/2018    8:47 AM 03/10/2011    4:21 PM  CBC  WBC 4.0 - 10.5 K/uL 6.8  5.2  9.6   Hemoglobin 13.0 - 17.0  g/dL 16.1  09.6  04.5   Hematocrit 39.0 - 52.0 % 42.1  48.2  46.7   Platelets 150 - 400 K/uL 161  216  178        Latest Ref Rng & Units 12/25/2022    5:22 PM 10/24/2018    9:42 AM 10/17/2018    8:47 AM  BMP  Glucose 70 - 99 mg/dL 409  811  914   BUN 8 - 23 mg/dL 21  27  26    Creatinine 0.61 - 1.24 mg/dL 7.82  9.56  2.13   Sodium 135 - 145 mmol/L 140  143  141   Potassium 3.5 - 5.1 mmol/L 3.8  4.1  5.7   Chloride 98 - 111 mmol/L 107  108  107   CO2 22 - 32 mmol/L 24  25  27    Calcium 8.9 - 10.3 mg/dL 9.2  9.4  9.9       Latest Ref Rng & Units 12/25/2022    5:22 PM 10/24/2018    9:42 AM 10/17/2018    8:47 AM  CMP  Glucose 70 - 99 mg/dL 086  578  469   BUN 8 - 23 mg/dL 21  27  26    Creatinine 0.61 - 1.24 mg/dL 6.29  5.28  4.13   Sodium 135 - 145 mmol/L 140  143  141   Potassium 3.5 - 5.1 mmol/L 3.8  4.1  5.7   Chloride 98 - 111 mmol/L 107  108  107   CO2 22 - 32 mmol/L 24  25  27    Calcium 8.9 - 10.3 mg/dL 9.2  9.4  9.9     No results found for: "CHOL", "HDL", "LDLCALC", "LDLDIRECT", "TRIG", "CHOLHDL" No results for input(s): "LIPOA" in the last 8760 hours. No components found for: "NTPROBNP" No results for input(s): "PROBNP" in the last 8760 hours. No results for input(s): "TSH" in the last 8760 hours.  Physical  Exam:    Today's Vitals   02/27/23 1443  BP: 120/78  Pulse: 90  Resp: 16  SpO2: 98%  Weight: 198 lb (89.8 kg)  Height: 6\' 1"  (1.854 m)   Body mass index is 26.12 kg/m. Wt Readings from Last 3 Encounters:  02/27/23 198 lb (89.8 kg)  12/25/22 200 lb (90.7 kg)  11/09/20 200 lb (90.7 kg)    Physical Exam  Constitutional: No distress.  hemodynamically stable  HENT:  Hearing aids bilaterally  Neck: No JVD present.  Cardiovascular: Normal rate, regular rhythm, S1 normal and S2 normal. Exam reveals no gallop, no S3 and no S4.  No murmur heard. Pulmonary/Chest: Effort normal and breath sounds normal. No stridor. He has no wheezes. He has no rales.  Abdominal: Soft. Bowel sounds are normal. He exhibits no distension. There is no abdominal tenderness.  Musculoskeletal:        General: No edema.     Cervical back: Neck supple.  Neurological: He is alert and oriented to person, place, and time. He has intact cranial nerves (2-12).  Skin: Skin is warm.     Impression & Recommendation(s):  Impression:   ICD-10-CM   1. Precordial pain  R07.2 EKG 12-Lead    Basic metabolic panel    ECHOCARDIOGRAM COMPLETE    CT CORONARY MORPH W/CTA COR W/SCORE W/CA W/CM &/OR WO/CM    metoprolol tartrate (LOPRESSOR) 25 MG tablet    predniSONE (DELTASONE) 50 MG tablet    diphenhydrAMINE (BENADRYL) 50 MG capsule    Basic metabolic panel  2. Aneurysm of ascending aorta without rupture (HCC)  I71.21     3. Erectile dysfunction, unspecified erectile dysfunction type  N52.9        Recommendation(s):  Precordial pain Index event September 2024. High sensitive troponins negative x 2. EKG is nonischemic. Noted to have mild CAC in the past -was started on Lipitor but did not tolerate it. Echo will be ordered to evaluate for structural heart disease and left ventricular systolic function. Coronary CTA to evaluate for obstructive disease and plaque burden. Patient will need to be medicated for his  shellfish allergy Further recommendations to follow  Aneurysm of ascending aorta without rupture [facility]) Noted on his coronary calcium score in July 2023: 40 mm CT of the chest abdomen and pelvis September 2024: 40 mm No formal diagnosis of benign essential hypertension. However I advised him to check his blood pressures at home to make sure that his blood pressures are well-controlled.  If antihypertensive medications are warranted recommend ARB +/- metoprolol. Also advised him to avoid fluoroquinolones Also has avoid isometric contractions and heavy lifting that would cause him to increase intraabdominal pressures.  Will see if there is a correlation between CT findings and echocardiography.  If there is correlation annual echocardiography could be considered as long as the dimensions remain relatively stable.    Orders Placed:  Orders Placed This Encounter  Procedures   CT CORONARY MORPH W/CTA COR W/SCORE W/CA W/CM &/OR WO/CM    Standing Status:   Future    Standing Expiration Date:   02/27/2024    Order Specific Question:   If indicated for the ordered procedure, I authorize the administration of contrast media per Radiology protocol    Answer:   Yes    Order Specific Question:   Initiate Coronary CTA Adult Protocol    Answer:   Yes    Order Specific Question:   If indicated initiate Post Coronary CTA Hypotension Adult Protocol    Answer:   Yes    Order Specific Question:   Does the patient have a contrast media/X-ray dye allergy?    Answer:   No    Order Specific Question:   Preferred Imaging Location?    Answer:   [facility]    Order Specific Question:   Authorization:    Answer:   FFR will be ordered if deemed medically necessary   Basic metabolic panel    Standing Status:   Future    Number of Occurrences:   1    Standing Expiration Date:   02/27/2024   EKG 12-Lead   ECHOCARDIOGRAM COMPLETE    Standing Status:   Future    Standing Expiration Date:   02/27/2024     Order Specific Question:   Where should this test be performed    Answer:   [facility])    Order Specific Question:   Does the patient weigh less than or greater than 250 lbs?    Answer:   Patient weighs less than 250 lbs    Order Specific Question:   Perflutren DEFINITY (image enhancing agent) should be administered unless hypersensitivity or allergy exist    Answer:   Administer Perflutren    Order Specific Question:   Reason for exam-Echo    Answer:   Chest Pain  R07.9    As part of medical decision making results of the ER documentation from September 2024, CTA chest abdomen pelvis from September 2024, EKG, labs were reviewed independently at  today's visit.   Final Medication List:    Meds ordered this encounter  Medications   metoprolol tartrate (LOPRESSOR) 25 MG tablet    Sig: Take 0.5 tablets (12.5 mg total) by mouth 2 (two) times daily. Take 90-120 minutes prior to scan. Hold for SBP less than 100 and/or heart rate less than 55. Start taking 1 week prior to scan. Stop taking once scan is complete.    Dispense:  7 tablet    Refill:  0   predniSONE (DELTASONE) 50 MG tablet    Sig: Take one tablet 13 hours, 7 hours, and 1 hour prior to scan.    Dispense:  3 tablet    Refill:  0   diphenhydrAMINE (BENADRYL) 50 MG capsule    Sig: Take one capsule 1 hour prior to scan.    Dispense:  1 capsule    Refill:  0    Medications Discontinued During This Encounter  Medication Reason   Omega-3 Fatty Acids (OMEGA-3 FISH OIL PO) Patient Preference     Current Outpatient Medications:    acetaminophen (TYLENOL) 500 MG tablet, Take 500 mg by mouth 2 (two) times daily as needed., Disp: , Rfl:    allopurinol (ZYLOPRIM) 300 MG tablet, Take 150 mg by mouth at bedtime. , Disp: , Rfl:    Alpha-Lipoic Acid 300 MG CAPS, Take 2 capsules by mouth daily., Disp: , Rfl:    B COMPLEX-BIOTIN-FA PO, Take 1 capsule by mouth daily. , Disp: , Rfl:    Coenzyme Q10 (CO Q 10 PO), Take 400  mg by mouth daily., Disp: , Rfl:    diphenhydrAMINE (BENADRYL) 50 MG capsule, Take one capsule 1 hour prior to scan., Disp: 1 capsule, Rfl: 0   fluocinonide ointment (LIDEX) 0.05 %, Apply 1 application topically as needed., Disp: , Rfl:    ibuprofen (ADVIL,MOTRIN) 200 MG tablet, Take 200 mg by mouth once., Disp: , Rfl:    loratadine (CLARITIN) 10 MG tablet, Take 10 mg by mouth daily as needed. , Disp: , Rfl:    metoprolol tartrate (LOPRESSOR) 25 MG tablet, Take 0.5 tablets (12.5 mg total) by mouth 2 (two) times daily. Take 90-120 minutes prior to scan. Hold for SBP less than 100 and/or heart rate less than 55. Start taking 1 week prior to scan. Stop taking once scan is complete., Disp: 7 tablet, Rfl: 0   Multiple Vitamins-Minerals (MULTIVITAMINS THER. W/MINERALS) TABS, Take 1 tablet by mouth daily.  , Disp: , Rfl:    omeprazole (PRILOSEC) 20 MG capsule, Take 20 mg by mouth daily., Disp: , Rfl:    predniSONE (DELTASONE) 50 MG tablet, Take one tablet 13 hours, 7 hours, and 1 hour prior to scan., Disp: 3 tablet, Rfl: 0   sildenafil (REVATIO) 20 MG tablet, Take 20 mg by mouth as needed. Take 2-5 tablets when needed, Disp: , Rfl:    vitamin B-12 (CYANOCOBALAMIN) 1000 MCG tablet, Take 1,000 mcg by mouth daily., Disp: , Rfl:  No current facility-administered medications for this visit.  Facility-Administered Medications Ordered in Other Visits:    gadopentetate dimeglumine (MAGNEVIST) injection 20 mL, 20 mL, Intravenous, Once PRN, York Spaniel, MD  Consent:   N/A  Disposition:   3 months sooner if needed Patient may be asked to follow-up sooner based on the results of the above-mentioned testing.  His questions and concerns were addressed to his satisfaction. He voices understanding of the recommendations provided during this encounter.    Signed, Delilah Shan, [facility] [place]  [facility]  [address] [place], [place] [address] 02/27/2023 5:15 PM

## 2023-02-27 NOTE — Patient Instructions (Addendum)
Medication Instructions:  Your physician has recommended you make the following change in your medication:   START Metoprolol Tartrate (Lopressor) 12.5 mg twice daily 1 WEEK prior to cardiac CT scan - HOLD if systolic blood pressure (top number) is less than 100 and/ or is heart rate is less than 55  -STOP after cardiac CT is complete   *If you need a refill on your cardiac medications before your next appointment, please call your pharmacy*  Lab Work: BMP today  If you have labs (blood work) drawn today and your tests are completely normal, you will receive your results only by: MyChart Message (if you have MyChart) OR A paper copy in the mail If you have any lab test that is abnormal or we need to change your treatment, we will call you to review the results.  Testing/Procedures: Your physician has requested that you have cardiac CT. Cardiac computed tomography (CT) is a painless test that uses an x-ray machine to take clear, detailed pictures of your heart. For further information please visit [email or web address] Please follow instruction sheet as given.  Your physician has requested that you have an echocardiogram. Echocardiography is a painless test that uses sound waves to create images of your heart. It provides your doctor with information about the size and shape of your heart and how well your heart's chambers and valves are working. This procedure takes approximately one hour. There are no restrictions for this procedure. Please do NOT wear cologne, perfume, aftershave, or lotions (deodorant is allowed). Please arrive 15 minutes prior to your appointment time.  Please note: We ask at that you not bring children with you during ultrasound (echo/ vascular) testing. Due to room size and safety concerns, children are not allowed in the ultrasound rooms during exams. Our front office staff cannot provide observation of children in our lobby area while testing is being conducted. An  adult accompanying a patient to their appointment will only be allowed in the ultrasound room at the discretion of the ultrasound technician under special circumstances. We apologize for any inconvenience.   Follow-Up: At [facility], you and your health needs are our priority.  As part of our continuing mission to provide you with exceptional heart care, we have created designated Provider Care Teams.  These Care Teams include your primary Cardiologist (physician) and Advanced Practice Providers (APPs -  Physician Assistants and Nurse Practitioners) who all work together to provide you with the care you need, when you need it.  Your next appointment:   3 month(s)  The format for your next appointment:   In Person  Provider:   Dr. Odis Hollingshead or an available APP {  Other Instructions   Your cardiac CT will be scheduled at one of the below locations:   [facility] [address] [place], [place] [address] [phone]  If scheduled at [facility], please arrive at the [facility] and Children's Entrance (Entrance C2) of [facility] 30 minutes prior to test start time. You can use the FREE valet parking offered at entrance C (encouraged to control the heart rate for the test)  Proceed to the [facility] Radiology Department (first floor) to check-in and test prep.  All radiology patients and guests should use entrance C2 at [facility], accessed from [facility], even though the hospital's physical address listed is [address].    Please follow these instructions carefully (unless otherwise directed):  An IV will be required for  this test and Nitroglycerin will be given.  Hold all erectile dysfunction medications at least 3 days (72 hrs) prior to test. (Ie viagra, cialis, sildenafil, tadalafil, etc)   On the Night Before the Test: Be sure to Drink plenty of water. Do not consume any caffeinated/decaffeinated beverages or  chocolate 12 hours prior to your test. Do not take any antihistamines 12 hours prior to your test. If the patient has contrast allergy: Patient will need a prescription for Prednisone and very clear instructions (as follows): Prednisone 50 mg - take 13 hours prior to test Take another Prednisone 50 mg 7 hours prior to test Take another Prednisone 50 mg 1 hour prior to test Take Benadryl 50 mg 1 hour prior to test Patient must complete all four doses of above prophylactic medications. Patient will need a ride after test due to Benadryl.  On the Day of the Test: Drink plenty of water until 1 hour prior to the test. Do not eat any food 1 hour prior to test. You may take your regular medications prior to the test.  Take metoprolol (Lopressor) two hours prior to test.     After the Test: Drink plenty of water. After receiving IV contrast, you may experience a mild flushed feeling. This is normal. On occasion, you may experience a mild rash up to 24 hours after the test. This is not dangerous. If this occurs, you can take Benadryl 25 mg and increase your fluid intake. If you experience trouble breathing, this can be serious. If it is severe call 911 IMMEDIATELY. If it is mild, please call our office.  We will call to schedule your test 2-4 weeks out understanding that some insurance companies will need an authorization prior to the service being performed.   For more information and frequently asked questions, please visit our website : [email or web address]  For non-scheduling related questions, please contact the cardiac imaging nurse navigator should you have any questions/concerns: Cardiac Imaging Nurse Navigators Direct Office Dial: [phone]   For scheduling needs, including cancellations and rescheduling, please call [place], [phone].

## 2023-02-28 LAB — BASIC METABOLIC PANEL
BUN/Creatinine Ratio: 20 (ref 10–24)
BUN: 19 mg/dL (ref 8–27)
CO2: 27 mmol/L (ref 20–29)
Calcium: 9.3 mg/dL (ref 8.6–10.2)
Chloride: 103 mmol/L (ref 96–106)
Creatinine, Ser: 0.94 mg/dL (ref 0.76–1.27)
Glucose: 95 mg/dL (ref 70–99)
Potassium: 4.3 mmol/L (ref 3.5–5.2)
Sodium: 141 mmol/L (ref 134–144)
eGFR: 85 mL/min/{1.73_m2} (ref >59)

## 2023-03-07 ENCOUNTER — Telehealth (HOSPITAL_COMMUNITY): Payer: Self-pay | Admitting: *Deleted

## 2023-03-07 NOTE — Telephone Encounter (Signed)
Reaching out to patient to offer assistance regarding upcoming cardiac imaging study; patient request to r/s.  Appt rescheduled.  Johney Frame RN Navigator Cardiac Imaging [facility] [phone] office [phone] cell

## 2023-03-08 ENCOUNTER — Ambulatory Visit (HOSPITAL_COMMUNITY): Payer: Medicare PPO

## 2023-03-21 ENCOUNTER — Telehealth (HOSPITAL_COMMUNITY): Payer: Self-pay | Admitting: *Deleted

## 2023-03-21 NOTE — Telephone Encounter (Signed)
Attempted to call patient regarding upcoming cardiac CT appointment. °Left message on voicemail with name and callback number ° °Edie Vallandingham RN Navigator Cardiac Imaging °[facility] °[phone] Office °[phone] Cell ° °

## 2023-03-21 NOTE — Telephone Encounter (Signed)
Patient returning call about his upcoming cardiac imaging study; pt verbalizes understanding of appt date/time, parking situation and where to check in, pre-test NPO status and medications ordered, and verified current allergies; name and call back number provided for further questions should they arise  Larey Brick RN Navigator Cardiac Imaging [facility] [phone] office [phone] cell  Patient denies allergy to IV contrast. He will take his last dose of metoprolol two hours prior to his cardiac CT scan. He is aware to arrive at 8:30 AM.

## 2023-03-22 ENCOUNTER — Ambulatory Visit (HOSPITAL_COMMUNITY)
Admission: RE | Admit: 2023-03-22 | Discharge: 2023-03-22 | Disposition: A | Discharge status: Home / Self Care | Payer: Medicare PPO | Source: Ambulatory Visit | Attending: Cardiology | Admitting: Cardiology

## 2023-03-22 DIAGNOSIS — R072 Precordial pain: Secondary | ICD-10-CM | POA: Insufficient documentation

## 2023-03-22 MED ORDER — NITROGLYCERIN 0.4 MG SL SUBL
0.8000 mg | SUBLINGUAL_TABLET | Freq: Once | SUBLINGUAL | Status: AC
  Administered 2023-03-22: 0.8 mg via SUBLINGUAL

## 2023-03-22 MED ORDER — NITROGLYCERIN 0.4 MG SL SUBL
SUBLINGUAL_TABLET | SUBLINGUAL | Status: AC
  Filled 2023-03-22: qty 2

## 2023-03-22 MED ORDER — IOHEXOL 350 MG/ML SOLN
95.0000 mL | Freq: Once | INTRAVENOUS | Status: AC | PRN
  Administered 2023-03-22: 95 mL via INTRAVENOUS

## 2023-03-30 ENCOUNTER — Encounter: Payer: Self-pay | Admitting: Urology

## 2023-03-30 ENCOUNTER — Encounter (HOSPITAL_COMMUNITY): Payer: Self-pay

## 2023-04-03 ENCOUNTER — Ambulatory Visit (HOSPITAL_COMMUNITY)
Admission: RE | Admit: 2023-04-03 | Payer: Medicare PPO | Source: Ambulatory Visit | Attending: Cardiology | Admitting: Cardiology

## 2023-04-10 ENCOUNTER — Ambulatory Visit
Admission: RE | Admit: 2023-04-10 | Discharge: 2023-04-10 | Disposition: A | Discharge status: Home / Self Care | Payer: Medicare PPO | Source: Ambulatory Visit | Attending: Urology

## 2023-04-10 DIAGNOSIS — R972 Elevated prostate specific antigen [PSA]: Secondary | ICD-10-CM | POA: Diagnosis not present

## 2023-04-10 DIAGNOSIS — N403 Nodular prostate with lower urinary tract symptoms: Secondary | ICD-10-CM

## 2023-04-10 MED ORDER — GADOPICLENOL 0.5 MMOL/ML IV SOLN
10.0000 mL | Freq: Once | INTRAVENOUS | Status: AC | PRN
  Administered 2023-04-10: 10 mL via INTRAVENOUS

## 2023-04-26 DIAGNOSIS — C61 Malignant neoplasm of prostate: Secondary | ICD-10-CM | POA: Diagnosis not present

## 2023-04-26 DIAGNOSIS — R972 Elevated prostate specific antigen [PSA]: Secondary | ICD-10-CM | POA: Diagnosis not present

## 2023-05-09 ENCOUNTER — Ambulatory Visit (HOSPITAL_COMMUNITY)
Admission: RE | Admit: 2023-05-09 | Discharge: 2023-05-09 | Disposition: A | Discharge status: Home / Self Care | Payer: Medicare PPO | Source: Ambulatory Visit | Attending: Cardiology | Admitting: Cardiology

## 2023-05-09 DIAGNOSIS — C61 Malignant neoplasm of prostate: Secondary | ICD-10-CM | POA: Diagnosis not present

## 2023-05-09 DIAGNOSIS — R072 Precordial pain: Secondary | ICD-10-CM | POA: Diagnosis not present

## 2023-05-09 LAB — ECHOCARDIOGRAM COMPLETE
AR max vel: 2.85 cm2
AV Area VTI: 3.03 cm2
AV Area mean vel: 2.83 cm2
AV Mean grad: 2 mm[Hg]
AV Peak grad: 3.6 mm[Hg]
Ao pk vel: 0.96 m/s
Area-P 1/2: 3.99 cm2
MV M vel: 1.01 m/s
MV Peak grad: 4.1 mm[Hg]
S' Lateral: 2.69 cm

## 2023-05-11 ENCOUNTER — Encounter: Payer: Self-pay | Admitting: Cardiology

## 2023-05-14 ENCOUNTER — Other Ambulatory Visit (HOSPITAL_COMMUNITY): Payer: Self-pay | Admitting: Urology

## 2023-05-14 DIAGNOSIS — N403 Nodular prostate with lower urinary tract symptoms: Secondary | ICD-10-CM

## 2023-05-14 DIAGNOSIS — C61 Malignant neoplasm of prostate: Secondary | ICD-10-CM

## 2023-05-24 ENCOUNTER — Encounter: Payer: Self-pay | Admitting: Radiation Oncology

## 2023-05-24 NOTE — Progress Notes (Signed)
 GU Location of Tumor / Histology: Prostate Ca  If Prostate Cancer, Gleason Score is (4 + 4) and PSA is (5.94 on 12/21/2022)  Jeffrey Li presented as referral from Dr. Modena Slater [facility]) for elevated PSA.  Biopsies      05/28/2023 Dr. Modena Slater NM PET (PSMA) Skull to Mid Thigh CLINICAL DATA:  Prostate carcinoma. High risk disease.   IMPRESSION: 1. Focal activity in the posterior RIGHT lobe of the prostate gland consistent with primary prostate adenocarcinoma. 2. No evidence of metastatic adenopathy in the pelvis or periaortic retroperitoneum. 3. No evidence of visceral metastasis or skeletal metastasis.   04/10/2023 Dr. Modena Slater MR Prostate with/without Contrast CLINICAL DATA: Elevated PSA level. Nodular prostate gland. R97.20   IMPRESSION: 1. Two PI-RADS category 4 lesions in the peripheral zone. Targeting data sent to [facility]. 2. Mild benign prostatic hypertrophy. 3. Sigmoid colon diverticulosis.   Past/Anticipated interventions by urology, if any: NA  Past/Anticipated interventions by medical oncology, if any: NA  Weight changes, if any: No  IPSS: 21 SHIM:  14  Bowel/Bladder complaints, if any:  Urinary frequency/urgency.  No bowel issues at this time.  Nausea/Vomiting, if any: No  Pain issues, if any: 3/10 dull joint pain from Orgovyx medication.  SAFETY ISSUES: Prior radiation? No Pacemaker/ICD? No Possible current pregnancy? Male Is the patient on methotrexate? No  Current Complaints / other details:  No Iodine or Betadine allergies per patient.

## 2023-05-28 ENCOUNTER — Encounter (HOSPITAL_COMMUNITY)
Admission: RE | Admit: 2023-05-28 | Discharge: 2023-05-28 | Disposition: A | Discharge status: Home / Self Care | Payer: Medicare PPO | Source: Ambulatory Visit | Attending: Urology | Admitting: Urology

## 2023-05-28 DIAGNOSIS — C61 Malignant neoplasm of prostate: Secondary | ICD-10-CM | POA: Diagnosis not present

## 2023-05-28 DIAGNOSIS — N403 Nodular prostate with lower urinary tract symptoms: Secondary | ICD-10-CM | POA: Insufficient documentation

## 2023-05-28 MED ORDER — FLOTUFOLASTAT F 18 GALLIUM 296-5846 MBQ/ML IV SOLN
8.7700 | Freq: Once | INTRAVENOUS | Status: AC
  Administered 2023-05-28: 8.77 via INTRAVENOUS

## 2023-05-29 NOTE — Progress Notes (Signed)
 STAT read request placed for PSMA PET for upcoming consult on 2/26.

## 2023-05-30 ENCOUNTER — Ambulatory Visit
Admission: RE | Admit: 2023-05-30 | Discharge: 2023-05-30 | Disposition: A | Discharge status: Home / Self Care | Payer: Medicare PPO | Source: Ambulatory Visit | Attending: Radiation Oncology | Admitting: Radiation Oncology

## 2023-05-30 ENCOUNTER — Encounter: Payer: Self-pay | Admitting: Radiation Oncology

## 2023-05-30 VITALS — BP 102/65 | HR 93 | Temp 97.3°F | Resp 18 | Ht 73.0 in | Wt 198.5 lb

## 2023-05-30 DIAGNOSIS — M109 Gout, unspecified: Secondary | ICD-10-CM | POA: Insufficient documentation

## 2023-05-30 DIAGNOSIS — Z191 Hormone sensitive malignancy status: Secondary | ICD-10-CM | POA: Diagnosis not present

## 2023-05-30 DIAGNOSIS — E785 Hyperlipidemia, unspecified: Secondary | ICD-10-CM | POA: Diagnosis not present

## 2023-05-30 DIAGNOSIS — Z79899 Other long term (current) drug therapy: Secondary | ICD-10-CM | POA: Insufficient documentation

## 2023-05-30 DIAGNOSIS — C61 Malignant neoplasm of prostate: Secondary | ICD-10-CM | POA: Insufficient documentation

## 2023-05-30 DIAGNOSIS — K219 Gastro-esophageal reflux disease without esophagitis: Secondary | ICD-10-CM | POA: Diagnosis not present

## 2023-05-30 DIAGNOSIS — I082 Rheumatic disorders of both aortic and tricuspid valves: Secondary | ICD-10-CM | POA: Insufficient documentation

## 2023-05-30 DIAGNOSIS — Z8042 Family history of malignant neoplasm of prostate: Secondary | ICD-10-CM | POA: Insufficient documentation

## 2023-05-30 DIAGNOSIS — G8929 Other chronic pain: Secondary | ICD-10-CM | POA: Diagnosis not present

## 2023-05-30 DIAGNOSIS — G4730 Sleep apnea, unspecified: Secondary | ICD-10-CM | POA: Diagnosis not present

## 2023-05-30 NOTE — Progress Notes (Signed)
 Radiation Oncology         [phone] ________________________________  Initial Outpatient Consultation  Name: Jeffrey Li MRN: [id number]  Date: 05/30/2023  DOB: September 26, 1950  CC:Farris Has, MD  Crista Elliot, MD   REFERRING PHYSICIAN: Crista Elliot, MD  DIAGNOSIS: 73 y.o. 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    ICD-10-CM   1. Malignant neoplasm of prostate (HCC)  C61       HISTORY OF PRESENT ILLNESS: Jeffrey Li is a 73 y.o. male with a diagnosis of prostate cancer. He was noted to have an elevated PSA of 5.94 on 12/21/22 by his primary care physician, Dr. Kateri Plummer.  Accordingly, he was referred for evaluation in urology by Dr. Alvester Morin on 02/16/23,  digital rectal examination performed at that time suggested a 50 gm gland with a right base nodule.  He had prostate MRI on 04/10/23 showing two PI-RADS category 4 lesions in the peripheral zone bilaterally.  The patient proceeded to MRI fusion transrectal ultrasound with 14 biopsies of the prostate on 04/26/23.  The prostate volume measured 51.33 cc.  Both MRI Region of Interest biopsies and 6 out of 12 standard core biopsies were positive.  The maximum Gleason score was 4+4, and this was seen in both ROIs, Right Lateral Base, Right Apex, Left Lateral Apex and Left Apex.   Subsequent PSMA PET scan on 05/28/23 showed focal activity in the posterior RIGHT lobe of the prostate gland consistent with primary prostate adenocarcinoma.  There was no evidence of metastatic adenopathy in the pelvis or periaortic retroperitoneum. There was also no evidence of visceral metastasis or skeletal metastasis.  The patient reviewed the biopsy results with his urologist and he has kindly been referred today for discussion of potential radiation treatment options.   PREVIOUS RADIATION THERAPY: No  PAST MEDICAL HISTORY:  Past Medical History:  Diagnosis Date   Acid reflux    Aortic  dilatation (HCC)    Arthritis    FINGERS , OA IN BACK    Chronic low back pain    Complication of anesthesia 1970   "TOO MUCH ANESTHETIC AFTER TRASHING AROUND HAD ANESTHESIA OVERDOSE"   Difficulty swallowing    Elevated PSA    Erectile dysfunction    GERD (gastroesophageal reflux disease)    Gingivitis    Gout    Hearing loss    Hyperlipidemia    Idiopathic peripheral neuropathy    Migraine headache    NONE IN 20 YEARS   Nausea    Peripheral neuropathy 07/03/2017   TOES AND FEET   Sleep apnea    NONE WITH LATEST TEST 20-19      PAST SURGICAL HISTORY: Past Surgical History:  Procedure Laterality Date   ERCP  03/14/2011   Procedure: ENDOSCOPIC RETROGRADE CHOLANGIOPANCREATOGRAPHY (ERCP);  Surgeon: Freddy Jaksch, MD;  Location: [facility];  Service: Endoscopy;  Laterality: N/A;   INGUINAL HERNIA REPAIR Bilateral 10/24/2018   Procedure: LAPAROSCOPIC BILATERAL INGUINAL HERNIAS REPAIR WITH MESH;  Surgeon: Gaynelle Adu, MD;  Location: [facility];  Service: General;  Laterality: Bilateral;   left knee  1986   SCOPE   PROSTATE BIOPSY     RETINAL TEAR REPAIR CRYOTHERAPY Right 2016   right shoulder  1970   Repair of torn AC joint right shoulder   SPHINCTEROTOMY  03/14/2011   Procedure: SPHINCTEROTOMY;  Surgeon: Freddy Jaksch, MD;  Location: [facility];  Service: Endoscopy;  Laterality: N/A;   SURGICAL REPAIR ZYGOMATIC ARCH  1977 OR 1978    FAMILY HISTORY:  Family History  Problem Relation Age of Onset   Stroke Mother    Prostate cancer Father    Glaucoma Father    Kidney failure Father    Thyroid disease Sister    Diverticulitis Sister    Thyroid disease Sister    Thyroid disease Sister    Migraines Brother    Liver disease Maternal Grandfather    Colon cancer Neg Hx     SOCIAL HISTORY:  Social History   Socioeconomic History   Marital status: Married    Spouse name: Not on file   Number of children: 6   Years of education: Not on file   Highest education  level: Doctorate  Occupational History   Not on file  Tobacco Use   Smoking status: Never   Smokeless tobacco: Never  Vaping Use   Vaping status: Never Used  Substance and Sexual Activity   Alcohol use: Yes    Comment: socially/rarely   Drug use: Never   Sexual activity: Not on file  Other Topics Concern   Not on file  Social History Narrative   Lives with wife   Caffeine use: 1 cup coffee per day   Left handed    Social Drivers of Health   Financial Resource Strain: Not on file  Food Insecurity: No Food Insecurity (05/30/2023)   Hunger Vital Sign    Worried About Running Out of Food in the Last Year: Never true    Ran Out of Food in the Last Year: Never true  Transportation Needs: No Transportation Needs (05/30/2023)   PRAPARE - Administrator, Civil Service (Medical): No    Lack of Transportation (Non-Medical): No  Physical Activity: Not on file  Stress: Not on file  Social Connections: Not on file  Intimate Partner Violence: Not At Risk (05/30/2023)   Humiliation, Afraid, Rape, and Kick questionnaire    Fear of Current or Ex-Partner: No    Emotionally Abused: No    Physically Abused: No    Sexually Abused: No    ALLERGIES: Other and Shellfish allergy  MEDICATIONS:  Current Outpatient Medications  Medication Sig Dispense Refill   ORGOVYX 120 MG tablet Take 120 mg by mouth daily.     acetaminophen (TYLENOL) 500 MG tablet Take 500 mg by mouth 2 (two) times daily as needed.     allopurinol (ZYLOPRIM) 300 MG tablet Take 150 mg by mouth at bedtime.      Alpha-Lipoic Acid 300 MG CAPS Take 2 capsules by mouth daily.     B COMPLEX-BIOTIN-FA PO Take 1 capsule by mouth daily.      Coenzyme Q10 (CO Q 10 PO) Take 400 mg by mouth daily.     fluocinonide ointment (LIDEX) 0.05 % Apply 1 application topically as needed.     ibuprofen (ADVIL,MOTRIN) 200 MG tablet Take 200 mg by mouth once.     loratadine (CLARITIN) 10 MG tablet Take 10 mg by mouth daily as needed.       Multiple Vitamins-Minerals (MULTIVITAMINS THER. W/MINERALS) TABS Take 1 tablet by mouth daily.       omeprazole (PRILOSEC) 20 MG capsule Take 20 mg by mouth daily.     sildenafil (REVATIO) 20 MG tablet Take 20 mg by mouth as needed. Take 2-5 tablets when needed     vitamin B-12 (CYANOCOBALAMIN) 1000 MCG tablet Take 1,000 mcg by mouth daily.  No current facility-administered medications for this encounter.   Facility-Administered Medications Ordered in Other Encounters  Medication Dose Route Frequency Provider Last Rate Last Admin   gadopentetate dimeglumine (MAGNEVIST) injection 20 mL  20 mL Intravenous Once PRN York Spaniel, MD          REVIEW OF SYSTEMS:  On review of systems, the patient reports that he is doing well overall. He denies any chest pain, shortness of breath, cough, fevers, chills, night sweats, unintended weight changes. He denies any bowel disturbances, and denies abdominal pain, nausea or vomiting. He denies any new musculoskeletal or joint aches or pains. His IPSS was Total Score: 21, indicating severe urinary symptoms (Reference 0-7 mild, 8-19 moderate, 20-35 severe).  His SHIM: 14, indicating he has moderate erectile dysfunction (Reference - 22-25 None, 17-21 Mild, 8-16 Moderate, 1-7 Severe). A complete review of systems is obtained and is otherwise negative.     PHYSICAL EXAM:  Wt Readings from Last 3 Encounters:  05/30/23 198 lb 8 oz (90 kg)  02/27/23 198 lb (89.8 kg)  12/25/22 200 lb (90.7 kg)   Temp Readings from Last 3 Encounters:  05/30/23 (!) 97.3 F (36.3 C) (Temporal)  12/25/22 99 F (37.2 C) (Oral)  10/24/18 97.8 F (36.6 C)   BP Readings from Last 3 Encounters:  05/30/23 102/65  03/22/23 100/73  02/27/23 120/78   Pulse Readings from Last 3 Encounters:  05/30/23 93  03/22/23 68  02/27/23 90    /10  In general this is a well appearing male in no acute distress. He's alert and oriented x4 and appropriate throughout the examination.  Cardiopulmonary assessment is negative for acute distress, and he exhibits normal effort.     KPS = 100  100 - Normal; no complaints; no evidence of disease. 90   - Able to carry on normal activity; minor signs or symptoms of disease. 80   - Normal activity with effort; some signs or symptoms of disease. 67   - Cares for self; unable to carry on normal activity or to do active work. 60   - Requires occasional assistance, but is able to care for most of his personal needs. 50   - Requires considerable assistance and frequent medical care. 40   - Disabled; requires special care and assistance. 30   - Severely disabled; hospital admission is indicated although death not imminent. 20   - Very sick; hospital admission necessary; active supportive treatment necessary. 10   - Moribund; fatal processes progressing rapidly. 0     - Dead  Karnofsky DA, Abelmann WH, Craver LS and Burchenal [facility] [phone]) The use of the nitrogen mustards in the palliative treatment of carcinoma: with particular reference to bronchogenic carcinoma Cancer 1 [id number]  LABORATORY DATA:  Lab Results  Component Value Date   WBC 6.8 12/25/2022   HGB 15.2 12/25/2022   HCT 42.1 12/25/2022   MCV 86.4 12/25/2022   PLT 161 12/25/2022   Lab Results  Component Value Date   NA 141 02/27/2023   K 4.3 02/27/2023   CL 103 02/27/2023   CO2 27 02/27/2023   Lab Results  Component Value Date   ALT 380 (H) 03/10/2011   AST 244 (H) 03/10/2011   ALKPHOS 184 (H) 03/10/2011   BILITOT 4.6 (H) 03/10/2011     RADIOGRAPHY: NM PET (PSMA) SKULL TO MID THIGH Result Date: 05/29/2023 CLINICAL DATA:  Prostate carcinoma.  High risk disease. EXAM: NUCLEAR MEDICINE PET SKULL BASE TO THIGH TECHNIQUE: 8.9 mCi  Flotufolastat (Posluma) was injected intravenously. Full-ring PET imaging was performed from the skull base to thigh after the radiotracer. CT data was obtained and used for attenuation correction and anatomic localization. COMPARISON:  None  Available. FINDINGS: NECK No radiotracer activity in neck lymph nodes. Incidental CT finding: None. CHEST No radiotracer accumulation within mediastinal or hilar lymph nodes. No suspicious pulmonary nodules on the CT scan. Incidental CT finding: None. ABDOMEN/PELVIS Prostate: Focus of activity in the posterior RIGHT lobe of the prostate gland SUV max of 5.6. Similar focus of posterior LEFT with less radiotracer activity. Lymph nodes: No abnormal radiotracer accumulation within pelvic or abdominal nodes. Liver: No evidence of liver metastasis. Incidental CT finding: Multiple diverticula of the descending colon and sigmoid colon without acute inflammation. SKELETON No focal activity to suggest skeletal metastasis. IMPRESSION: 1. Focal activity in the posterior RIGHT lobe of the prostate gland consistent with primary prostate adenocarcinoma. 2. No evidence of metastatic adenopathy in the pelvis or periaortic retroperitoneum. 3. No evidence of visceral metastasis or skeletal metastasis. Electronically Signed   By: Genevive Bi M.D.   On: 05/29/2023 19:14   ECHOCARDIOGRAM COMPLETE Result Date: 05/09/2023    ECHOCARDIOGRAM REPORT   Patient Name:   Jeffrey Li Date of Exam: 05/09/2023 Medical Rec #:  [id number]      Height:       73.0 in Accession #:    [id number]     Weight:       198.0 lb Date of Birth:  1950-07-10       BSA:          2.142 m Patient Age:    72 years       BP:           104/63 mmHg Patient Gender: M              HR:           74 bpm. Exam Location:  Outpatient Procedure: 2D Echo, 3D Echo, Cardiac Doppler, Color Doppler and Strain Analysis Indications:    Precordial pain [R07.2 (ICD-10-CM)]  History:        Patient has no prior history of Echocardiogram examinations.                 CAD; Risk Factors:Dyslipidemia. Ascending aortic aneurysm 4.0cm                 by CTA 12/25/2022.  Sonographer:    Gertie Fey MHA, RDMS, RVT, RDCS Referring Phys: [id number] [facility]  Sonographer Comments:  Suboptimal subcostal window. Global longitudinal strain was attempted. IMPRESSIONS  1. Left ventricular ejection fraction, by estimation, is 65 to 70%. Left ventricular ejection fraction by 3D volume is 68 %. The left ventricle has normal function. The left ventricle has no regional wall motion abnormalities. Left ventricular diastolic  parameters are consistent with Grade Li diastolic dysfunction (pseudonormalization). The average left ventricular global longitudinal strain is -24.0 %. The global longitudinal strain is normal.  2. Right ventricular systolic function is normal. The right ventricular size is mildly enlarged. A Prominent moderator band is visualized.  3. Left atrial size was moderately dilated.  4. The mitral valve is degenerative. Trivial mitral valve regurgitation. No evidence of mitral stenosis.  5. The aortic valve is normal in structure. Aortic valve regurgitation is trivial. No aortic stenosis is present. Aortic valve area, by VTI measures 3.03 cm. Aortic valve mean gradient measures 2.0 mmHg. Aortic valve Vmax measures 0.96 m/s.  6. Ascending aorta measurements  are within normal limits for age when indexed to body surface area. FINDINGS  Left Ventricle: Left ventricular ejection fraction, by estimation, is 65 to 70%. Left ventricular ejection fraction by 3D volume is 68 %. The left ventricle has normal function. The left ventricle has no regional wall motion abnormalities. The average left ventricular global longitudinal strain is -24.0 %. The global longitudinal strain is normal. The left ventricular internal cavity size was normal in size. There is no left ventricular hypertrophy. Left ventricular diastolic parameters are consistent  with Grade Li diastolic dysfunction (pseudonormalization). Normal left ventricular filling pressure. Right Ventricle: The right ventricular size is mildly enlarged. No increase in right ventricular wall thickness. Right ventricular systolic function is normal.  Left Atrium: Left atrial size was moderately dilated. Right Atrium: Right atrial size was normal in size. Pericardium: There is no evidence of pericardial effusion. Mitral Valve: The mitral valve is degenerative in appearance. There is mild thickening of the mitral valve leaflet(s). There is mild calcification of the mitral valve leaflet(s). Trivial mitral valve regurgitation. No evidence of mitral valve stenosis. Tricuspid Valve: The tricuspid valve is normal in structure. Tricuspid valve regurgitation is mild . No evidence of tricuspid stenosis. Aortic Valve: The aortic valve is normal in structure. Aortic valve regurgitation is trivial. No aortic stenosis is present. Aortic valve mean gradient measures 2.0 mmHg. Aortic valve peak gradient measures 3.6 mmHg. Aortic valve area, by VTI measures 3.03 cm. Pulmonic Valve: The pulmonic valve was normal in structure. Pulmonic valve regurgitation is not visualized. No evidence of pulmonic stenosis. Aorta: The aortic root is normal in size and structure. Ascending aorta measurements are within normal limits for age when indexed to body surface area. IAS/Shunts: No atrial level shunt detected by color flow Doppler. Additional Comments: A Prominent moderator band is visualized.  LEFT VENTRICLE PLAX 2D LVIDd:         4.57 cm         Diastology LVIDs:         2.69 cm         LV e' medial:    5.55 cm/s LV PW:         1.32 cm         LV E/e' medial:  13.9 LV IVS:        1.09 cm         LV e' lateral:   7.18 cm/s LVOT diam:     2.02 cm         LV E/e' lateral: 10.7 LV SV:         55 LV SV Index:   26              2D LVOT Area:     3.20 cm        Longitudinal                                Strain                                2D Strain GLS  -25.2 %                                (A2C):  2D Strain GLS  -23.1 %                                (A3C):                                2D Strain GLS  -23.7 %                                (A4C):                                 2D Strain GLS  -24.0 %                                Avg:                                 3D Volume EF                                LV 3D EF:    Left                                             ventricul                                             ar                                             ejection                                             fraction                                             by 3D                                             volume is                                             68 %.  3D Volume EF:                                3D EF:        68 %                                LV EDV:       134 ml                                LV ESV:       44 ml                                LV SV:        91 ml RIGHT VENTRICLE RV Basal diam:  4.16 cm RV Mid diam:    3.70 cm RV S prime:     14.60 cm/s TAPSE (M-mode): 2.5 cm LEFT ATRIUM             Index        RIGHT ATRIUM           Index LA diam:        4.10 cm 1.91 cm/m   RA Area:     19.80 cm LA Vol (A2C):   74.7 ml 34.87 ml/m  RA Volume:   48.80 ml  22.78 ml/m LA Vol (A4C):   91.9 ml 42.89 ml/m LA Biplane Vol: 88.8 ml 41.45 ml/m  AORTIC VALVE AV Area (Vmax):    2.85 cm AV Area (Vmean):   2.83 cm AV Area (VTI):     3.03 cm AV Vmax:           95.50 cm/s AV Vmean:          64.700 cm/s AV VTI:            0.181 m AV Peak Grad:      3.6 mmHg AV Mean Grad:      2.0 mmHg LVOT Vmax:         84.90 cm/s LVOT Vmean:        57.100 cm/s LVOT VTI:          0.171 m LVOT/AV VTI ratio: 0.94  AORTA Ao Root diam: 3.42 cm Ao Asc diam:  4.04 cm MITRAL VALVE               TRICUSPID VALVE MV Area (PHT): 3.99 cm    TR Peak grad:   7.8 mmHg MV Decel Time: 190 msec    TR Vmax:        140.00 cm/s MR Peak grad: 4.1 mmHg MR Vmax:      101.00 cm/s  SHUNTS MV E velocity: 77.00 cm/s  Systemic VTI:  0.17 m MV A velocity: 85.90 cm/s  Systemic Diam: 2.02 cm MV E/A ratio:  0.90 Armanda Magic MD Electronically signed by Armanda Magic MD Signature Date/Time: 05/09/2023/2:12:15 PM    Final       IMPRESSION/PLAN: 1. 73 y.o. 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 We discussed the patient's workup and outlined the nature of prostate cancer in this setting. The patient's Gleason's score puts him into the high risk group. Accordingly, he  is eligible for a variety of potential treatment options including ADT concurrent with brachytherapy, 5.5-8 weeks of external radiation, 5 weeks of external radiation with an upfront brachytherapy boost, or prostatectomy. We discussed the available radiation techniques, and focused on the details and logistics of delivery. The patient may not be an ideal candidate for brachytherapy/boost with his severe IPSS score.  We discussed and outlined the risks, benefits, short and long-term effects associated with radiotherapy and compared and contrasted these with prostatectomy. We discussed the role of SpaceOAR gel in reducing the rectal toxicity associated with radiotherapy. We also detailed the role of ADT in the treatment of high risk prostate cancer and outlined the associated side effects that could be expected with this therapy.  He appears to have a good understanding of his disease and our treatment recommendations which are of curative intent.  He was encouraged to ask questions that were answered to his stated satisfaction.  At the conclusion of our conversation, the patient is interested in moving forward with considering his treatment options.  If he elects to pursue radiation, I would recommend 2 years of LT-ADT, and 8 weeks IMRT to the prostate and pelvic lymph nodes.  We personally spent 45 minutes in this encounter including chart review, reviewing radiological studies, meeting face-to-face with the patient, entering orders and completing documentation.      Margaretmary Dys, MD  [facility]  Radiation Oncology Direct Dial:  [phone]  Fax: [phone] [email or web address]  Skype  [email or web address]

## 2023-05-31 NOTE — Progress Notes (Signed)
 Spoke with patient via telephone to introduce myself as the prostate nurse navigator and discussed my role.  No barriers to care identified at this time.  Patient started Orgovyx on 2/5 and is still contemplating his final treatment decision of radiation vs proceeding with a surgical consult.  RN provided my direct number and encouraged him to call with any questions or treatment decision.  Verbalized understanding.

## 2023-06-08 ENCOUNTER — Encounter: Payer: Self-pay | Admitting: Cardiology

## 2023-06-08 ENCOUNTER — Ambulatory Visit: Payer: Medicare PPO | Attending: Cardiology | Admitting: Cardiology

## 2023-06-08 VITALS — BP 110/70 | HR 61 | Resp 18 | Ht 73.0 in | Wt 200.0 lb

## 2023-06-08 DIAGNOSIS — N529 Male erectile dysfunction, unspecified: Secondary | ICD-10-CM | POA: Diagnosis not present

## 2023-06-08 DIAGNOSIS — R931 Abnormal findings on diagnostic imaging of heart and coronary circulation: Secondary | ICD-10-CM | POA: Diagnosis not present

## 2023-06-08 DIAGNOSIS — I7121 Aneurysm of the ascending aorta, without rupture: Secondary | ICD-10-CM | POA: Diagnosis not present

## 2023-06-08 NOTE — Progress Notes (Signed)
 Cardiology Office Note:    Date:  06/08/2023  NAME:  Jeffrey Li    MRN: [id number] DOB:  [birth year]   PCP:  Farris Has, MD  Former Cardiology Providers: None Primary Cardiologist:  Tessa Lerner, DO, [facility] (established care 02/27/2023) Electrophysiologist:  None    Chief Complaint  Patient presents with   Precordial pain   Follow-up    3 month    History of Present Illness:    Jeffrey Li is a 73 y.o. 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 He is being seen today for the evaluation of Chest pain at the request of Pa, [facility]  Patient was referred to the practice in November 2024 for evaluation of chest pain.  Shared decision was to proceed forward with echocardiogram and coronary CTA.  Patient is here for follow-up.  Since last office visit he denies any anginal chest pain or heart failure symptoms.  Overall function capacity remains stable.  Reviewed the details of the echocardiogram and coronary CTA.  His coronary calcium score in the past as well as a CT dissection protocol noted the ascending aorta to be dilated at 40 mm.  However, the echocardiogram reillustrated similar dimensions of the ascending aorta but when corrected for BSA it is within normal limits.  Patient is also been checking his blood pressures at home which are less than 120 mmHg.  Current Medications: Current Meds  Medication Sig   acetaminophen (TYLENOL) 500 MG tablet Take 500 mg by mouth 2 (two) times daily as needed.   allopurinol (ZYLOPRIM) 300 MG tablet Take 150 mg by mouth at bedtime.    Alpha-Lipoic Acid 300 MG CAPS Take 2 capsules by mouth daily.   Ascorbic Acid (VITAMIN C) 1000 MG tablet Take 1,000 mg by mouth daily.   B COMPLEX-BIOTIN-FA PO Take 1 capsule by mouth  daily.    Coenzyme Q10 (CO Q 10 PO) Take 400 mg by mouth daily.   fluocinonide ointment (LIDEX) 0.05 % Apply 1 application topically as needed.   ibuprofen (ADVIL,MOTRIN) 200 MG tablet Take 200 mg by mouth once.   loratadine (CLARITIN) 10 MG tablet Take 10 mg by mouth daily as needed.    Multiple Vitamins-Minerals (MULTIVITAMINS THER. W/MINERALS) TABS Take 1 tablet by mouth daily.     omeprazole (PRILOSEC) 20 MG capsule Take 20 mg by mouth daily.   ORGOVYX 120 MG tablet Take 120 mg by mouth daily.   Polyethyl Glycol-Propyl Glycol (SYSTANE ULTRA PF OP)    sildenafil (REVATIO) 20 MG tablet Take 20 mg by mouth as needed. Take 2-5 tablets when needed   vitamin B-12 (CYANOCOBALAMIN) 1000 MCG tablet Take 1,000 mcg by mouth daily.     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    Chronic low back pain    Complication of anesthesia 1970   "TOO MUCH ANESTHETIC AFTER TRASHING AROUND HAD ANESTHESIA OVERDOSE"   Difficulty swallowing    Elevated PSA    Erectile dysfunction    GERD (gastroesophageal reflux disease)    Gingivitis    Gout    Hearing loss    Hyperlipidemia  Idiopathic peripheral neuropathy    Migraine headache    NONE IN 20 YEARS   Nausea    Peripheral neuropathy 07/03/2017   TOES AND FEET   Sleep apnea    NONE WITH LATEST TEST 20-19    Past Surgical History: Past Surgical History:  Procedure Laterality Date   ERCP  03/14/2011   Procedure: ENDOSCOPIC RETROGRADE CHOLANGIOPANCREATOGRAPHY (ERCP);  Surgeon: Freddy Jaksch, MD;  Location: [facility];  Service: Endoscopy;  Laterality: N/A;   INGUINAL HERNIA REPAIR Bilateral 10/24/2018   Procedure: LAPAROSCOPIC BILATERAL INGUINAL HERNIAS REPAIR WITH MESH;  Surgeon: Gaynelle Adu, MD;  Location: [facility];  Service: General;  Laterality: Bilateral;   left knee  1986   SCOPE    PROSTATE BIOPSY     RETINAL TEAR REPAIR CRYOTHERAPY Right 2016   right shoulder  1970   Repair of torn AC joint right shoulder   SPHINCTEROTOMY  03/14/2011   Procedure: SPHINCTEROTOMY;  Surgeon: Freddy Jaksch, MD;  Location: [facility];  Service: Endoscopy;  Laterality: N/A;   SURGICAL REPAIR ZYGOMATIC ARCH  1977 OR 1978    Social History: Social History   Tobacco Use   Smoking status: Never   Smokeless tobacco: Never  Vaping Use   Vaping status: Never Used  Substance Use Topics   Alcohol use: Yes    Comment: socially/rarely   Drug use: Never    Family History: Family History  Problem Relation Age of Onset   Stroke Mother    Prostate cancer Father    Glaucoma Father    Kidney failure Father    Thyroid disease Sister    Diverticulitis Sister    Thyroid disease Sister    Thyroid disease Sister    Migraines Brother    Liver disease Maternal Grandfather    Colon cancer Neg Hx     ROS:   Review of Systems  Cardiovascular:  Negative for chest pain, claudication, irregular heartbeat, leg swelling, near-syncope, orthopnea, palpitations, paroxysmal nocturnal dyspnea and syncope.  Respiratory:  Negative for shortness of breath.   Hematologic/Lymphatic: Negative for bleeding problem.    EKGs/Labs/Other Studies Reviewed:   Echocardiogram: May 09, 2023 LVEF: 65 to 70%, average global longitudinal strain -24% Diastolic Function: Grade 2 Moderate LAE No significant valvular heart disease See report for additional details   Coronary CTA 03/22/2023: 1. Minimal nonobstructive CAD, CADRADS = 1.  2. Coronary calcium score of 31.5. This was 21st percentile for age-, sex-, and race- matched controls.  3. Total plaque volume 50 mm3 which is 5th percentile for age- and sex- matched controls (calcified plaque 7 mm3; noncalcified plaque 43 mm3). Total plaque volume is mild.  4. Normal coronary origin with right dominance.  5. Radiology Overread: No acute extracardiac  incidental finding   RADIOLOGY: CTA chest abdomen pelvis dissection protocol September 2024 1. Aortic atherosclerosis with aneurysmal dilatation of the ascending aorta measuring 4.0 cm, unchanged. No dissection is seen. Recommend annual imaging followup by CTA or MRA. This recommendation follows 2010 ACCF/AHA/AATS/ACR/ASA/SCA/SCAI/SIR/STS/SVM Guidelines for the Diagnosis and Management of Patients with Thoracic Aortic Disease. Circulation. 2010; 121: Z610-R604. Aortic aneurysm NOS (ICD10-I71.9) 2. Mild to moderate narrowing and irregularity involving proximal SMA, possibly representing thrombus or motion artifact. 3. No acute process in the chest, abdomen, or pelvis. 4. Coronary artery calcification. 5. Sigmoid diverticulosis without diverticulitis.  Labs:    Latest Ref Rng & Units 12/25/2022    5:22 PM 10/17/2018    8:47 AM 03/10/2011    4:21 PM  CBC  WBC 4.0 - 10.5 K/uL 6.8  5.2  9.6   Hemoglobin 13.0 - 17.0 g/dL 86.5  78.4  69.6   Hematocrit 39.0 - 52.0 % 42.1  48.2  46.7   Platelets 150 - 400 K/uL 161  216  178        Latest Ref Rng & Units 02/27/2023    3:54 PM 12/25/2022    5:22 PM 10/24/2018    9:42 AM  BMP  Glucose 70 - 99 mg/dL 95  295  284   BUN 8 - 27 mg/dL 19  21  27    Creatinine 0.76 - 1.27 mg/dL 1.32  4.40  1.02   BUN/Creat Ratio 10 - 24 20     Sodium 134 - 144 mmol/L 141  140  143   Potassium 3.5 - 5.2 mmol/L 4.3  3.8  4.1   Chloride 96 - 106 mmol/L 103  107  108   CO2 20 - 29 mmol/L 27  24  25    Calcium 8.6 - 10.2 mg/dL 9.3  9.2  9.4       Latest Ref Rng & Units 02/27/2023    3:54 PM 12/25/2022    5:22 PM 10/24/2018    9:42 AM  CMP  Glucose 70 - 99 mg/dL 95  725  366   BUN 8 - 27 mg/dL 19  21  27    Creatinine 0.76 - 1.27 mg/dL 4.40  3.47  4.25   Sodium 134 - 144 mmol/L 141  140  143   Potassium 3.5 - 5.2 mmol/L 4.3  3.8  4.1   Chloride 96 - 106 mmol/L 103  107  108   CO2 20 - 29 mmol/L 27  24  25    Calcium 8.6 - 10.2 mg/dL 9.3  9.2  9.4     No  results found for: "CHOL", "HDL", "LDLCALC", "LDLDIRECT", "TRIG", "CHOLHDL" No results for input(s): "LIPOA" in the last 8760 hours. No components found for: "NTPROBNP" No results for input(s): "PROBNP" in the last 8760 hours. No results for input(s): "TSH" in the last 8760 hours.  Physical Exam:    Today's Vitals   06/08/23 0901  BP: 110/70  Pulse: 61  Resp: 18  SpO2: 95%  Weight: 200 lb (90.7 kg)  Height: 6\' 1"  (1.854 m)   Body mass index is 26.39 kg/m. Wt Readings from Last 3 Encounters:  06/08/23 200 lb (90.7 kg)  05/30/23 198 lb 8 oz (90 kg)  02/27/23 198 lb (89.8 kg)    Physical Exam  Constitutional: No distress.  hemodynamically stable  HENT:  Hearing aids bilaterally  Neck: No JVD present.  Cardiovascular: Normal rate, regular rhythm, S1 normal and S2 normal. Exam reveals no gallop, no S3 and no S4.  No murmur heard. Pulmonary/Chest: Effort normal and breath sounds normal. No stridor. He has no wheezes. He has no rales.  Abdominal: Soft. Bowel sounds are normal. He exhibits no distension. There is no abdominal tenderness.  Musculoskeletal:        General: No edema.     Cervical back: Neck supple.  Neurological: He is alert and oriented to person, place, and time. He has intact cranial nerves (2-12).  Skin: Skin is warm.     Impression & Recommendation(s):  Impression:   ICD-10-CM   1. Agatston coronary artery calcium score less than 100  R93.1     2. Aneurysm of ascending aorta without rupture (HCC)  I71.21     3. Erectile dysfunction, unspecified erectile  dysfunction type  N52.9        Recommendation(s):  Agatston coronary artery calcium score less than 100 As per his most recent coronary CTA December 2024: Coronary calcium score of 31.5. This was 21st percentile for age-, sex-, and race- matched controls. Mild to total plaque volume and minimal nonobstructive CAD. Given the fact that his CAC correlates to less than 25 percentile we will hold off on  aspirin 81 mg p.o. daily. Reemphasized importance of improving his modifiable cardiovascular risk factors. No additional testing warranted at this time  Aneurysm of ascending aorta without rupture [facility]) Noted on his coronary calcium score in July 2023: 40 mm CT of the chest abdomen and pelvis September 2024: 40 mm CTA December 2024: 38 mm double oblique at the level of the PA bifurcation. Echo 02/25: Aortic root at 34mm and Ascending aorta 40mm, but ascending aorta measurements are within normal limits for age when indexed to body surface area.  No formal diagnosis of benign essential hypertension since last office visit home blood pressures still remain relatively well-controlled. Patient would still like to be monitored periodically but is reassured that when corrected for BSA the dimensions are within normal limits. Would still recommend avoiding fluoroquinolone antibiotics if possible as well as prolonged isometric contractions/heavy lifting that would increase intra-abdominal pressures.    Orders Placed:  No orders of the defined types were placed in this encounter.  Final Medication List:    No orders of the defined types were placed in this encounter.   There are no discontinued medications.    Current Outpatient Medications:    acetaminophen (TYLENOL) 500 MG tablet, Take 500 mg by mouth 2 (two) times daily as needed., Disp: , Rfl:    allopurinol (ZYLOPRIM) 300 MG tablet, Take 150 mg by mouth at bedtime. , Disp: , Rfl:    Alpha-Lipoic Acid 300 MG CAPS, Take 2 capsules by mouth daily., Disp: , Rfl:    Ascorbic Acid (VITAMIN C) 1000 MG tablet, Take 1,000 mg by mouth daily., Disp: , Rfl:    B COMPLEX-BIOTIN-FA PO, Take 1 capsule by mouth daily. , Disp: , Rfl:    Coenzyme Q10 (CO Q 10 PO), Take 400 mg by mouth daily., Disp: , Rfl:    fluocinonide ointment (LIDEX) 0.05 %, Apply 1 application topically as needed., Disp: , Rfl:    ibuprofen (ADVIL,MOTRIN) 200 MG tablet, Take 200 mg by  mouth once., Disp: , Rfl:    loratadine (CLARITIN) 10 MG tablet, Take 10 mg by mouth daily as needed. , Disp: , Rfl:    Multiple Vitamins-Minerals (MULTIVITAMINS THER. W/MINERALS) TABS, Take 1 tablet by mouth daily.  , Disp: , Rfl:    omeprazole (PRILOSEC) 20 MG capsule, Take 20 mg by mouth daily., Disp: , Rfl:    ORGOVYX 120 MG tablet, Take 120 mg by mouth daily., Disp: , Rfl:    Polyethyl Glycol-Propyl Glycol (SYSTANE ULTRA PF OP), , Disp: , Rfl:    sildenafil (REVATIO) 20 MG tablet, Take 20 mg by mouth as needed. Take 2-5 tablets when needed, Disp: , Rfl:    vitamin B-12 (CYANOCOBALAMIN) 1000 MCG tablet, Take 1,000 mcg by mouth daily., Disp: , Rfl:  No current facility-administered medications for this visit.  Facility-Administered Medications Ordered in Other Visits:    gadopentetate dimeglumine (MAGNEVIST) injection 20 mL, 20 mL, Intravenous, Once PRN, York Spaniel, MD  Consent:   N/A  Disposition:    1 year follow-up sooner if needed.  His questions and  concerns were addressed to his satisfaction. He voices understanding of the recommendations provided during this encounter.    Signed, Tessa Lerner, DO, [facility] [place]  [facility]  [address] [place], [place] [address] 06/08/2023 11:28 AM

## 2023-06-08 NOTE — Patient Instructions (Signed)
 Medication Instructions:  Your physician recommends that you continue on your current medications as directed. Please refer to the Current Medication list given to you today.  *If you need a refill on your cardiac medications before your next appointment, please call your pharmacy*  Lab Work: None ordered today. If you have labs (blood work) drawn today and your tests are completely normal, you will receive your results only by: MyChart Message (if you have MyChart) OR A paper copy in the mail If you have any lab test that is abnormal or we need to change your treatment, we will call you to review the results.  Testing/Procedures: None ordered today.  Follow-Up: At [facility], you and your health needs are our priority.  As part of our continuing mission to provide you with exceptional heart care, we have created designated Provider Care Teams.  These Care Teams include your primary Cardiologist (physician) and Advanced Practice Providers (APPs -  Physician Assistants and Nurse Practitioners) who all work together to provide you with the care you need, when you need it.  We recommend signing up for the patient portal called "MyChart".  Sign up information is provided on this After Visit Summary.  MyChart is used to connect with patients for Virtual Visits (Telemedicine).  Patients are able to view lab/test results, encounter notes, upcoming appointments, etc.  Non-urgent messages can be sent to your provider as well.   To learn more about what you can do with MyChart, go to [email or web address].    Your next appointment:   1 year(s)  The format for your next appointment:   In Person  Provider:   Tessa Lerner, DO {

## 2023-06-14 NOTE — Progress Notes (Signed)
 RN spoke with patient to follow up regarding treatment decision. Patient remains undecided regarding the recommendation of 8 weeks of IMRT.  Patient denies any questions at this time and will follow up with Dr. Alvester Morin 3/18.  Will continue to follow.

## 2023-06-19 DIAGNOSIS — C61 Malignant neoplasm of prostate: Secondary | ICD-10-CM | POA: Diagnosis not present

## 2023-06-21 NOTE — Progress Notes (Signed)
 Patient had follow up with urology on 3/18.  Remains undecided with final decision prostatectomy vs ADT-radiation.  Patient proceed with consult with Dr. Allena Katz for discussion of prostatectomy and plans to finalize treatment decision within the next week or so.  Patient remains on Orgovyx at this time.  RN will follow up to review decision.

## 2023-06-26 ENCOUNTER — Encounter: Payer: Self-pay | Admitting: Cardiology

## 2023-06-27 ENCOUNTER — Telehealth: Payer: Self-pay | Admitting: *Deleted

## 2023-06-27 NOTE — Telephone Encounter (Signed)
   Pre-operative Risk Assessment    Patient Name: Jeffrey Li  DOB: [birth year] MRN: [id number]   Date of last office visit: 06-08-2023  Date of next office visit: 06-2024    Request for Surgical Clearance     Procedure:   ROBOTIC RADICAL PROSTATECTOMY   Date of Surgery:  Clearance TBD                               Surgeon:  DR Aura Dials  Surgeon's Group or Practice Name:  DR. CARDIOLOGIST OFFICE [facility]) Phone number:  [phone] Fax number:  [phone]   Type of Clearance Requested:   - Medical    Type of Anesthesia:  General       SignedOleta Mouse   06/27/2023, 2:39 PM

## 2023-06-27 NOTE — Telephone Encounter (Signed)
   Primary Cardiologist: Tessa Lerner, DO  Chart reviewed as part of pre-operative protocol coverage. Given past medical history and time since last visit, based on ACC/AHA guidelines, Jeffrey Li would be at acceptable risk for the planned procedure without further cardiovascular testing.   Patient was advised that if he develops new symptoms prior to surgery to contact our office to arrange a follow-up appointment.  He verbalized understanding.  I will route this recommendation to the requesting party via Epic fax function and remove from pre-op pool.  Please call with questions.  Levi Aland, NP-C 06/27/2023, 3:05 PM [address] Office [phone] Fax [phone]

## 2023-07-02 NOTE — Telephone Encounter (Signed)
 Called twice and it goes to voicemail. Staff tried as well and same outcome.   Rickie Gutierres Kenly, DO, [facility]

## 2023-07-03 NOTE — Progress Notes (Signed)
 Patient has confirmed he will proceed with prostatectomy with Dr. Allena Katz on 6/26.  Dr. Allena Katz recommendations for patient to continue Orgovyx prior to day of surgery.     RN will inform patient's local urologist, Dr. Alvester Morin of treatment decision to ensure appropriate follow up's are scheduled.   No additional needs at this time.

## 2023-07-10 DIAGNOSIS — C61 Malignant neoplasm of prostate: Secondary | ICD-10-CM | POA: Diagnosis not present

## 2023-07-10 DIAGNOSIS — Z01818 Encounter for other preprocedural examination: Secondary | ICD-10-CM | POA: Diagnosis not present

## 2023-07-10 DIAGNOSIS — I77819 Aortic ectasia, unspecified site: Secondary | ICD-10-CM | POA: Diagnosis not present

## 2023-07-11 ENCOUNTER — Encounter: Payer: Self-pay | Admitting: Cardiology

## 2023-07-11 NOTE — Telephone Encounter (Signed)
 Spoke to the patient over the phone.  Preoperative letter in his MyChart.  Please send the last progress note, echocardiogram, and coronary CTA results to Dr. Aura Dials.  Jeffrey Deangelo Rudolph, DO, [facility]

## 2023-07-11 NOTE — Telephone Encounter (Signed)
 Spoke with Byrd Hesselbach in the surgical department at [facility] to verify that they had received all required pre-op paperwork and notes from our office for the pt to proceed with their procedure. Byrd Hesselbach stated everything they needed from our office has been obtained.

## 2023-08-23 DIAGNOSIS — Z961 Presence of intraocular lens: Secondary | ICD-10-CM | POA: Diagnosis not present

## 2023-08-23 DIAGNOSIS — H524 Presbyopia: Secondary | ICD-10-CM | POA: Diagnosis not present

## 2023-08-23 DIAGNOSIS — H43813 Vitreous degeneration, bilateral: Secondary | ICD-10-CM | POA: Diagnosis not present

## 2023-08-23 DIAGNOSIS — H52202 Unspecified astigmatism, left eye: Secondary | ICD-10-CM | POA: Diagnosis not present

## 2023-08-23 DIAGNOSIS — H04123 Dry eye syndrome of bilateral lacrimal glands: Secondary | ICD-10-CM | POA: Diagnosis not present

## 2023-08-30 DIAGNOSIS — Z01818 Encounter for other preprocedural examination: Secondary | ICD-10-CM | POA: Diagnosis not present

## 2023-09-26 DIAGNOSIS — R972 Elevated prostate specific antigen [PSA]: Secondary | ICD-10-CM | POA: Diagnosis not present

## 2023-09-26 DIAGNOSIS — N401 Enlarged prostate with lower urinary tract symptoms: Secondary | ICD-10-CM | POA: Diagnosis not present

## 2023-09-26 DIAGNOSIS — C61 Malignant neoplasm of prostate: Secondary | ICD-10-CM | POA: Diagnosis not present

## 2023-09-26 DIAGNOSIS — Z6825 Body mass index (BMI) 25.0-25.9, adult: Secondary | ICD-10-CM | POA: Diagnosis not present

## 2023-09-27 DIAGNOSIS — R972 Elevated prostate specific antigen [PSA]: Secondary | ICD-10-CM | POA: Diagnosis not present

## 2023-09-27 DIAGNOSIS — G629 Polyneuropathy, unspecified: Secondary | ICD-10-CM | POA: Diagnosis not present

## 2023-09-27 DIAGNOSIS — Z9079 Acquired absence of other genital organ(s): Secondary | ICD-10-CM | POA: Diagnosis not present

## 2023-09-27 DIAGNOSIS — J939 Pneumothorax, unspecified: Secondary | ICD-10-CM | POA: Diagnosis not present

## 2023-09-27 DIAGNOSIS — C61 Malignant neoplasm of prostate: Secondary | ICD-10-CM | POA: Diagnosis not present

## 2023-09-27 DIAGNOSIS — M109 Gout, unspecified: Secondary | ICD-10-CM | POA: Diagnosis not present

## 2023-09-27 DIAGNOSIS — Z8249 Family history of ischemic heart disease and other diseases of the circulatory system: Secondary | ICD-10-CM | POA: Diagnosis not present

## 2023-09-27 DIAGNOSIS — I899 Noninfective disorder of lymphatic vessels and lymph nodes, unspecified: Secondary | ICD-10-CM | POA: Diagnosis not present

## 2023-09-27 DIAGNOSIS — Z9889 Other specified postprocedural states: Secondary | ICD-10-CM | POA: Diagnosis not present

## 2023-09-27 DIAGNOSIS — Z8739 Personal history of other diseases of the musculoskeletal system and connective tissue: Secondary | ICD-10-CM | POA: Diagnosis not present

## 2023-09-27 DIAGNOSIS — Z8546 Personal history of malignant neoplasm of prostate: Secondary | ICD-10-CM | POA: Diagnosis not present

## 2023-09-27 DIAGNOSIS — K219 Gastro-esophageal reflux disease without esophagitis: Secondary | ICD-10-CM | POA: Diagnosis not present

## 2023-09-27 DIAGNOSIS — G8918 Other acute postprocedural pain: Secondary | ICD-10-CM | POA: Diagnosis not present

## 2023-09-27 DIAGNOSIS — M1990 Unspecified osteoarthritis, unspecified site: Secondary | ICD-10-CM | POA: Diagnosis not present

## 2023-09-27 DIAGNOSIS — Z8709 Personal history of other diseases of the respiratory system: Secondary | ICD-10-CM | POA: Diagnosis not present

## 2023-09-27 DIAGNOSIS — Z79899 Other long term (current) drug therapy: Secondary | ICD-10-CM | POA: Diagnosis not present

## 2023-09-28 DIAGNOSIS — R972 Elevated prostate specific antigen [PSA]: Secondary | ICD-10-CM | POA: Diagnosis not present

## 2023-09-28 DIAGNOSIS — Z8249 Family history of ischemic heart disease and other diseases of the circulatory system: Secondary | ICD-10-CM | POA: Diagnosis not present

## 2023-09-28 DIAGNOSIS — Z8709 Personal history of other diseases of the respiratory system: Secondary | ICD-10-CM | POA: Diagnosis not present

## 2023-09-28 DIAGNOSIS — M109 Gout, unspecified: Secondary | ICD-10-CM | POA: Diagnosis not present

## 2023-09-28 DIAGNOSIS — Z9079 Acquired absence of other genital organ(s): Secondary | ICD-10-CM | POA: Diagnosis not present

## 2023-09-28 DIAGNOSIS — C61 Malignant neoplasm of prostate: Secondary | ICD-10-CM | POA: Diagnosis not present

## 2023-09-28 DIAGNOSIS — Z8546 Personal history of malignant neoplasm of prostate: Secondary | ICD-10-CM | POA: Diagnosis not present

## 2023-09-28 DIAGNOSIS — G629 Polyneuropathy, unspecified: Secondary | ICD-10-CM | POA: Diagnosis not present

## 2023-09-28 DIAGNOSIS — Z8739 Personal history of other diseases of the musculoskeletal system and connective tissue: Secondary | ICD-10-CM | POA: Diagnosis not present

## 2023-09-28 DIAGNOSIS — Z79899 Other long term (current) drug therapy: Secondary | ICD-10-CM | POA: Diagnosis not present

## 2023-09-28 DIAGNOSIS — M1990 Unspecified osteoarthritis, unspecified site: Secondary | ICD-10-CM | POA: Diagnosis not present

## 2023-09-28 DIAGNOSIS — K219 Gastro-esophageal reflux disease without esophagitis: Secondary | ICD-10-CM | POA: Diagnosis not present

## 2023-10-02 DIAGNOSIS — Z5189 Encounter for other specified aftercare: Secondary | ICD-10-CM | POA: Diagnosis not present

## 2023-10-02 DIAGNOSIS — Z9079 Acquired absence of other genital organ(s): Secondary | ICD-10-CM | POA: Diagnosis not present

## 2023-10-02 DIAGNOSIS — N393 Stress incontinence (female) (male): Secondary | ICD-10-CM | POA: Diagnosis not present

## 2023-10-02 DIAGNOSIS — N5231 Erectile dysfunction following radical prostatectomy: Secondary | ICD-10-CM | POA: Diagnosis not present

## 2023-10-02 DIAGNOSIS — C61 Malignant neoplasm of prostate: Secondary | ICD-10-CM | POA: Diagnosis not present

## 2023-10-02 DIAGNOSIS — Z6825 Body mass index (BMI) 25.0-25.9, adult: Secondary | ICD-10-CM | POA: Diagnosis not present

## 2023-10-19 DIAGNOSIS — N393 Stress incontinence (female) (male): Secondary | ICD-10-CM | POA: Diagnosis not present

## 2023-10-19 DIAGNOSIS — S92354A Nondisplaced fracture of fifth metatarsal bone, right foot, initial encounter for closed fracture: Secondary | ICD-10-CM | POA: Diagnosis not present

## 2023-10-19 DIAGNOSIS — R350 Frequency of micturition: Secondary | ICD-10-CM | POA: Diagnosis not present

## 2023-10-19 DIAGNOSIS — M6281 Muscle weakness (generalized): Secondary | ICD-10-CM | POA: Diagnosis not present

## 2023-10-23 ENCOUNTER — Other Ambulatory Visit: Payer: Self-pay | Admitting: Physician Assistant

## 2023-10-23 DIAGNOSIS — C61 Malignant neoplasm of prostate: Secondary | ICD-10-CM

## 2023-10-24 DIAGNOSIS — M6281 Muscle weakness (generalized): Secondary | ICD-10-CM | POA: Diagnosis not present

## 2023-10-24 DIAGNOSIS — M62838 Other muscle spasm: Secondary | ICD-10-CM | POA: Diagnosis not present

## 2023-10-24 DIAGNOSIS — N393 Stress incontinence (female) (male): Secondary | ICD-10-CM | POA: Diagnosis not present

## 2023-10-25 ENCOUNTER — Ambulatory Visit
Admission: RE | Admit: 2023-10-25 | Discharge: 2023-10-25 | Disposition: A | Discharge status: Home / Self Care | Source: Ambulatory Visit | Attending: Physician Assistant | Admitting: Physician Assistant

## 2023-10-25 DIAGNOSIS — C61 Malignant neoplasm of prostate: Secondary | ICD-10-CM | POA: Diagnosis not present

## 2023-10-29 DIAGNOSIS — S92354A Nondisplaced fracture of fifth metatarsal bone, right foot, initial encounter for closed fracture: Secondary | ICD-10-CM | POA: Diagnosis not present

## 2023-11-05 DIAGNOSIS — C61 Malignant neoplasm of prostate: Secondary | ICD-10-CM | POA: Diagnosis not present

## 2023-11-14 DIAGNOSIS — S92351D Displaced fracture of fifth metatarsal bone, right foot, subsequent encounter for fracture with routine healing: Secondary | ICD-10-CM | POA: Diagnosis not present

## 2023-11-14 DIAGNOSIS — M79671 Pain in right foot: Secondary | ICD-10-CM | POA: Diagnosis not present

## 2023-11-14 DIAGNOSIS — S92354A Nondisplaced fracture of fifth metatarsal bone, right foot, initial encounter for closed fracture: Secondary | ICD-10-CM | POA: Diagnosis not present

## 2023-11-16 DIAGNOSIS — Z131 Encounter for screening for diabetes mellitus: Secondary | ICD-10-CM | POA: Diagnosis not present

## 2023-11-16 DIAGNOSIS — Z0000 Encounter for general adult medical examination without abnormal findings: Secondary | ICD-10-CM | POA: Diagnosis not present

## 2023-11-16 DIAGNOSIS — E785 Hyperlipidemia, unspecified: Secondary | ICD-10-CM | POA: Diagnosis not present

## 2023-11-16 DIAGNOSIS — M109 Gout, unspecified: Secondary | ICD-10-CM | POA: Diagnosis not present

## 2023-11-16 DIAGNOSIS — G629 Polyneuropathy, unspecified: Secondary | ICD-10-CM | POA: Diagnosis not present

## 2023-11-22 DIAGNOSIS — M6281 Muscle weakness (generalized): Secondary | ICD-10-CM | POA: Diagnosis not present

## 2023-11-22 DIAGNOSIS — M109 Gout, unspecified: Secondary | ICD-10-CM | POA: Diagnosis not present

## 2023-11-22 DIAGNOSIS — Z8546 Personal history of malignant neoplasm of prostate: Secondary | ICD-10-CM | POA: Diagnosis not present

## 2023-11-22 DIAGNOSIS — I77819 Aortic ectasia, unspecified site: Secondary | ICD-10-CM | POA: Diagnosis not present

## 2023-11-22 DIAGNOSIS — M62838 Other muscle spasm: Secondary | ICD-10-CM | POA: Diagnosis not present

## 2023-11-22 DIAGNOSIS — Z0000 Encounter for general adult medical examination without abnormal findings: Secondary | ICD-10-CM | POA: Diagnosis not present

## 2023-11-22 DIAGNOSIS — R490 Dysphonia: Secondary | ICD-10-CM | POA: Diagnosis not present

## 2023-11-22 DIAGNOSIS — N393 Stress incontinence (female) (male): Secondary | ICD-10-CM | POA: Diagnosis not present

## 2023-11-22 DIAGNOSIS — E785 Hyperlipidemia, unspecified: Secondary | ICD-10-CM | POA: Diagnosis not present

## 2023-11-26 ENCOUNTER — Encounter (INDEPENDENT_AMBULATORY_CARE_PROVIDER_SITE_OTHER): Payer: Self-pay

## 2023-12-07 DIAGNOSIS — C61 Malignant neoplasm of prostate: Secondary | ICD-10-CM | POA: Diagnosis not present

## 2023-12-07 DIAGNOSIS — N393 Stress incontinence (female) (male): Secondary | ICD-10-CM | POA: Diagnosis not present

## 2023-12-07 DIAGNOSIS — N5231 Erectile dysfunction following radical prostatectomy: Secondary | ICD-10-CM | POA: Diagnosis not present

## 2023-12-13 DIAGNOSIS — N393 Stress incontinence (female) (male): Secondary | ICD-10-CM | POA: Diagnosis not present

## 2023-12-13 DIAGNOSIS — M6281 Muscle weakness (generalized): Secondary | ICD-10-CM | POA: Diagnosis not present

## 2023-12-28 ENCOUNTER — Ambulatory Visit (INDEPENDENT_AMBULATORY_CARE_PROVIDER_SITE_OTHER): Admitting: Otolaryngology

## 2023-12-28 VITALS — BP 116/79 | HR 82 | Temp 97.7°F | Ht 73.5 in | Wt 188.0 lb

## 2023-12-28 DIAGNOSIS — R0982 Postnasal drip: Secondary | ICD-10-CM

## 2023-12-28 DIAGNOSIS — K219 Gastro-esophageal reflux disease without esophagitis: Secondary | ICD-10-CM

## 2023-12-28 DIAGNOSIS — R490 Dysphonia: Secondary | ICD-10-CM | POA: Diagnosis not present

## 2023-12-28 DIAGNOSIS — J383 Other diseases of vocal cords: Secondary | ICD-10-CM

## 2023-12-28 DIAGNOSIS — J3089 Other allergic rhinitis: Secondary | ICD-10-CM

## 2023-12-28 NOTE — Progress Notes (Signed)
 ENT CONSULT:  Reason for Consult: chronic dysphonia   HPI: Discussed the use of AI scribe software for clinical note transcription with the patient, who gave verbal consent to proceed.  History of Present Illness Jeffrey Li is a 73 year old male who presents with voice changes and hoarseness.  He has experienced voice changes and hoarseness for a couple of years, with a noted worsening over time. The hoarseness is variable, changing day by day, but not by season. No pain when talking and he has not completely lost his voice for decades. Earlier this week, his voice was so faint that his wife and kids could barely understand him.  He has a history of reflux, managed with omeprazole. He also has a history of postnasal drainage, initially treated with [place] once daily, which was later increased to twice daily without improvement. He then switched to Flonase, which has helped with the drainage but not with the voice issue.  No pain when talking and no complete voice loss. No trouble with swallowing   Records Reviewed:  Discharge summary 09/28/23 73 y.o.  male past medical history of CA prostate, history of gout, osteoarthritis, came to the [place]  [facility] for robotic assisted laparoscopic prostatectomy underwent the procedure and tolerated well.  Patient was started him on a diet and ambulation he tolerated well. After the Urology Services clearance patient was cleared for discharge. Advised to follow up in the outpatient clinic for removal of Foley catheter.     Past Medical History:  Diagnosis Date   Acid reflux    Aortic dilatation    Arthritis    FINGERS , OA IN BACK    Chronic low back pain    Complication of anesthesia 1970   TOO MUCH ANESTHETIC AFTER TRASHING AROUND HAD ANESTHESIA OVERDOSE   Difficulty swallowing    Elevated PSA    Erectile dysfunction    GERD (gastroesophageal reflux disease)    Gingivitis    Gout    Hearing loss    Hyperlipidemia     Idiopathic peripheral neuropathy    Migraine headache    NONE IN 20 YEARS   Nausea    Peripheral neuropathy 07/03/2017   TOES AND FEET   Sleep apnea    NONE WITH LATEST TEST 20-19    Past Surgical History:  Procedure Laterality Date   ERCP  03/14/2011   Procedure: ENDOSCOPIC RETROGRADE CHOLANGIOPANCREATOGRAPHY (ERCP);  Surgeon: Elsie CHRISTELLA Cree, MD;  Location: [facility];  Service: Endoscopy;  Laterality: N/A;   INGUINAL HERNIA REPAIR Bilateral 10/24/2018   Procedure: LAPAROSCOPIC BILATERAL INGUINAL HERNIAS REPAIR WITH MESH;  Surgeon: Tanda Locus, MD;  Location: [facility];  Service: General;  Laterality: Bilateral;   left knee  1986   SCOPE   PROSTATE BIOPSY     RETINAL TEAR REPAIR CRYOTHERAPY Right 2016   right shoulder  1970   Repair of torn AC joint right shoulder   SPHINCTEROTOMY  03/14/2011   Procedure: SPHINCTEROTOMY;  Surgeon: Elsie CHRISTELLA Cree, MD;  Location: [facility];  Service: Endoscopy;  Laterality: N/A;   SURGICAL REPAIR ZYGOMATIC ARCH  1977 OR 1978    Family History  Problem Relation Age of Onset   Stroke Mother    Prostate cancer Father    Glaucoma Father    Kidney failure Father    Thyroid disease Sister    Diverticulitis Sister    Thyroid disease Sister    Thyroid disease Sister    Migraines Brother    Liver  disease Maternal Grandfather    Colon cancer Neg Hx     Social History:  reports that he has never smoked. He has never used smokeless tobacco. He reports current alcohol use. He reports that he does not use drugs.  Allergies:  Allergies  Allergen Reactions   Other     Melons including cucumbers-stomach  upset   Shellfish Allergy Nausea And Vomiting    mollusk    Medications: I have reviewed the patient's current medications.  The PMH, PSH, Medications, Allergies, and SH were reviewed and updated.  ROS: Constitutional: Negative for fever, weight loss and weight gain. Cardiovascular: Negative for chest pain and dyspnea on  exertion. Respiratory: Is not experiencing shortness of breath at rest. Gastrointestinal: Negative for nausea and vomiting. Neurological: Negative for headaches. Psychiatric: The patient is not nervous/anxious  There were no vitals taken for this visit. There is no height or weight on file to calculate BMI.  PHYSICAL EXAM:  Exam: General: Well-developed, well-nourished Respiratory Respiratory effort: Equal inspiration and expiration without stridor Cardiovascular Peripheral Vascular: Warm extremities with equal color/perfusion Eyes: No nystagmus with equal extraocular motion bilaterally Neuro/Psych/Balance: Patient oriented to person, place, and time; Appropriate mood and affect; Gait is intact with no imbalance; Cranial nerves I-XII are intact Head and Face Inspection: Normocephalic and atraumatic without mass or lesion Palpation: Facial skeleton intact without bony stepoffs Salivary Glands: No mass or tenderness Facial Strength: Facial motility symmetric and full bilaterally ENT Pinna: External ear intact and fully developed External canal: Canal is patent with intact skin Tympanic Membrane: Clear and mobile External Nose: No scar or anatomic deformity Internal Nose: Septum is straight. No polyp, or purulence. Mucosal edema and erythema present.  Bilateral inferior turbinate hypertrophy.  Lips, Teeth, and gums: Mucosa and teeth intact and viable TMJ: No pain to palpation with full mobility Oral cavity/oropharynx: No erythema or exudate, no lesions present Nasopharynx: No mass or lesion with intact mucosa Hypopharynx: Intact mucosa without pooling of secretions Larynx Glottic: Full true vocal cord mobility without lesion or mass VF atrophy and glottic gap Supraglottic: Normal appearing epiglottis and AE folds Interarytenoid Space: Moderate pachydermia&edema Subglottic Space: Patent without lesion or edema Neck Neck and Trachea: Midline trachea without mass or  lesion Thyroid: No mass or nodularity Lymphatics: No lymphadenopathy  Procedure:  Preoperative diagnosis: hoarseness  Postoperative diagnosis:   same VF atrophy glottic insufficiency  Procedure: Flexible fiberoptic laryngoscopy with stroboscopy (68420)   Surgeon: Elena Larry, MD  Anesthesia: Topical lidocaine  and Afrin  Complications: None  Condition is stable throughout exam  Indications and consent:   The patient presents to the clinic with hoarseness. All the risks, benefits, and potential complications were reviewed with the patient preoperatively and informed verbal consent was obtained.  Procedure: The patient was seated upright in the exam chair.   Topical lidocaine  and Afrin were applied to the nasal cavity. After adequate anesthesia had occurred, the flexible telescope with strobe capabilities was passed into the nasal cavity. The nasopharynx was patent without mass or lesion. The scope was passed behind the soft palate and directed toward the base of tongue. The base of tongue was visualized and was symmetric with no apparent masses or abnormal appearing tissue. There were no signs of a mass or pooling of secretions in the piriform sinuses. The supraglottic structures were normal.  The true vocal cords are mobile. The medial edges were bowed. Closure was incomplete. Periodicity present. The mucosal wave and amplitude were intact and symmetric. There is moderate interarytenoid  pachydermia and post cricoid edema. The mucosa appears without lesions.   The laryngoscope was then slowly withdrawn and the patient tolerated the procedure well. There were no complications or blood loss.    Studies Reviewed: 05/28/23 PET/CT IMPRESSION: 1. Focal activity in the posterior RIGHT lobe of the prostate gland consistent with primary prostate adenocarcinoma. 2. No evidence of metastatic adenopathy in the pelvis or periaortic retroperitoneum. 3. No evidence of visceral metastasis or  skeletal metastasis  Assessment/Plan: No diagnosis found.  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 No mass or tumor. Bowed vocal cords with incomplete closure causing hoarseness.  - Discussed voice therapy and vocal cord injection augmentation. - Voice therapy involves exercises with a speech therapist to optimize breath support and improve projection. - Vocal cord injection augmentation involves gel injection to plump cords, lasting 9-12 months, performed in-office. - Provided information about vocal cord injection augmentation and speech therapy. - Advised to contact office if he decides to pursue speech therapy or vocal cord injection. At this time he would like to hold off on both  Gastroesophageal reflux disease (GERD) Chronic GERD managed with omeprazole. GERD can contribute to hoarseness.  -  Reflux Gourmet after meals - diet and lifestyle changes to minimize GERD - Refer to [employer] blog for dietary and lifestyle modifications/reflux cook book  Postnasal drip Chronic postnasal drip managed with Flonase, effective for symptoms but not resolving voice issues. - continue Flonase for post-nasal drainage noted on exam today      Thank you for allowing me to participate in the care of this patient. Please do not hesitate to contact me with any questions or concerns.   Elena Larry, MD Otolaryngology [facility] Phone: [phone] Fax: [phone]    12/28/2023, 9:34 AM

## 2023-12-28 NOTE — Patient Instructions (Addendum)
 [email or web address] - check out this website to learn more about reflux   -Avoid lying down for at least two hours after a meal or after drinking acidic beverages, like soda, or other caffeinated beverages. This can help to prevent stomach contents from flowing back into the esophagus. -Keep your head elevated while you sleep. Using an extra pillow or two can also help to prevent reflux. -Eat smaller and more frequent meals each day instead of a few large meals. This promotes digestion and can aid in preventing heartburn. -Wear loose-fitting clothes to ease pressure on the stomach, which can worsen heartburn and reflux. -Reduce excess weight around the midsection. This can ease pressure on the stomach. Such pressure can force some stomach contents back up the esophagus  - Take Reflux Gourmet (natural supplement available on [employer]) to help with symptoms of chronic throat irritation     HYALURONIC ACID (RESTYLANE) INJECTION TO THE VOCAL CORD What is Hyaluronic Acid? Hyaluronic Acid is a synthetic substance. It contains a substance that is found in our subcutaneous tissue, or the soft tissue just beneath our skin. It has also been used in plastic surgery to fill in wrinkles or plump lips.    Hyaluronic Acid and the Vocal Cords Vocal cord problems are usually due to a lack of movement, bowing (loss of muscle) or weakness of the vocal cords. This lack of motion makes speech difficult. A lack of vocal cord motion can also cause:  Low, raspy or rough voice  Constant hoarseness  Breathing difficulty  Inability to speak above a whisper  Coughing or choking when swallowing Hyaluronic Acid increases the size or mass of the vocal cord where muscle loss has occurred. It acts like a space filler. Hyaluronic Acid is injected next to the vocal cords and into the muscles supporting the vocal cords. The body slowly reabsorbs it over time, and its effects usually last about 4-6 months, although this range  varies for each patient.  Hyaluronic Acid Treatment You will be awake during the treatment and able to talk and breathe normally. Numbing medicines are used before the procedure to decrease the discomfort. The treatment will take about 5-10 minutes.    You should not eat a big meal before the injection.  In the office: You will sit up in a chair for the injection. A fiberoptic camera called a nasoendoscope is placed in a nostril and will go down to the base of your throat. This will allow the doctor to see your vocal cords. An injection of Hyaluronic Acid can be given in several different ways, including via your mouth, directly into the surface of the vocal cord, or through the skin near your "Adam's apple" on your throat. If it is given through the skin, the lidocaine is injected into the skin to numb it before the injection.  If it is given via the mouth, the lining of the throat is numbed with liquid lidocaine first. We often offer a dose of valium to be taken before the procedure if you have someone who can drive you home afterward. Call the office if you would like this prescribed for you, then pick it up at your pharmacy and take it 1 hour before the procedure. Many patients choose not to take the valium, and they are able to drive themselves to and from the appointment.  In the operating room: You will be asleep under general anesthesia, with a breathing tube in. The procedure takes approximately 10 minutes and is done in  a hospital or surgery center.  Risks include (but are not limited to) swelling, infection or allergic reaction, pain, extravasation into a more superficial layer of the vocal cord (making the voice more strained), inflammation, or failure to improve the voice. Rarely, additional procedures are needed to address these complications.  The voice is usually strained for a few days, then settles in by 4-7 days. Some patients may sound very good after the injection but find that  their voice fades quickly, much earlier than the usual 4-6 months. These patients may benefit from a second injection right away, and they should call the office.  If you have any trouble breathing or severe pain with swallowing or talking, you should call the office right away. It is not unusual to have a sore throat for a few days afterward, but if this soreness or pain is severe, you should call the office. You should always call the office for any questions or issues.  After a Hyaluronic Acid Treatment  You can talk right after your treatment.  If you had the procedure done in the office, you should not eat or drink for 1 hour, or until the sensation in your throat completely returns to normal  You may have some discomfort or pain that radiates from your throat to your ear.  You will follow up with your doctor as instructed.  The Hyaluronic Acid procedure results can last from 3-6 months, but this is different for every patient.

## 2024-01-11 DIAGNOSIS — L570 Actinic keratosis: Secondary | ICD-10-CM | POA: Diagnosis not present

## 2024-01-11 DIAGNOSIS — L819 Disorder of pigmentation, unspecified: Secondary | ICD-10-CM | POA: Diagnosis not present

## 2024-01-11 DIAGNOSIS — L814 Other melanin hyperpigmentation: Secondary | ICD-10-CM | POA: Diagnosis not present

## 2024-01-11 DIAGNOSIS — L821 Other seborrheic keratosis: Secondary | ICD-10-CM | POA: Diagnosis not present

## 2024-01-11 DIAGNOSIS — D1801 Hemangioma of skin and subcutaneous tissue: Secondary | ICD-10-CM | POA: Diagnosis not present

## 2024-01-11 DIAGNOSIS — D692 Other nonthrombocytopenic purpura: Secondary | ICD-10-CM | POA: Diagnosis not present

## 2024-02-07 DIAGNOSIS — Z8546 Personal history of malignant neoplasm of prostate: Secondary | ICD-10-CM | POA: Diagnosis not present

## 2024-02-07 DIAGNOSIS — C61 Malignant neoplasm of prostate: Secondary | ICD-10-CM | POA: Diagnosis not present

## 2024-02-07 DIAGNOSIS — E785 Hyperlipidemia, unspecified: Secondary | ICD-10-CM | POA: Diagnosis not present
